# Patient Record
Sex: Female | Born: 1965 | Hispanic: Yes | Smoking: Never smoker
Health system: Southern US, Community
[De-identification: ages and names within clinical notes are randomized; demographics above are authoritative.]

## PROBLEM LIST (undated history)

## (undated) DIAGNOSIS — M51369 Other intervertebral disc degeneration, lumbar region without mention of lumbar back pain or lower extremity pain: Secondary | ICD-10-CM

## (undated) DIAGNOSIS — F329 Major depressive disorder, single episode, unspecified: Secondary | ICD-10-CM

## (undated) DIAGNOSIS — F32A Depression, unspecified: Secondary | ICD-10-CM

## (undated) DIAGNOSIS — K219 Gastro-esophageal reflux disease without esophagitis: Secondary | ICD-10-CM

## (undated) DIAGNOSIS — M5136 Other intervertebral disc degeneration, lumbar region: Secondary | ICD-10-CM

## (undated) DIAGNOSIS — E039 Hypothyroidism, unspecified: Secondary | ICD-10-CM

## (undated) DIAGNOSIS — M797 Fibromyalgia: Secondary | ICD-10-CM

## (undated) HISTORY — DX: Gastro-esophageal reflux disease without esophagitis: K21.9

## (undated) HISTORY — DX: Other intervertebral disc degeneration, lumbar region without mention of lumbar back pain or lower extremity pain: M51.369

## (undated) HISTORY — DX: Depression, unspecified: F32.A

## (undated) HISTORY — DX: Fibromyalgia: M79.7

## (undated) HISTORY — DX: Other intervertebral disc degeneration, lumbar region: M51.36

## (undated) HISTORY — PX: CARPAL TUNNEL RELEASE: SHX101

## (undated) HISTORY — DX: Hypothyroidism, unspecified: E03.9

---
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
## 2007-12-06 HISTORY — PX: CERVICAL FUSION: SHX112

## 2012-09-11 DIAGNOSIS — M5126 Other intervertebral disc displacement, lumbar region: Secondary | ICD-10-CM | POA: Insufficient documentation

## 2012-12-05 HISTORY — PX: LUMBAR LAMINECTOMY: SHX95

## 2018-03-22 ENCOUNTER — Encounter (INDEPENDENT_AMBULATORY_CARE_PROVIDER_SITE_OTHER): Payer: Self-pay

## 2018-03-22 ENCOUNTER — Encounter: Payer: Self-pay | Admitting: Physician Assistant

## 2018-03-22 ENCOUNTER — Ambulatory Visit (INDEPENDENT_AMBULATORY_CARE_PROVIDER_SITE_OTHER): Payer: Self-pay | Admitting: Physician Assistant

## 2018-03-22 VITALS — BP 130/75 | HR 72 | Ht 63.5 in | Wt 189.0 lb

## 2018-03-22 DIAGNOSIS — K219 Gastro-esophageal reflux disease without esophagitis: Secondary | ICD-10-CM

## 2018-03-22 DIAGNOSIS — Z6832 Body mass index (BMI) 32.0-32.9, adult: Secondary | ICD-10-CM

## 2018-03-22 DIAGNOSIS — M797 Fibromyalgia: Secondary | ICD-10-CM

## 2018-03-22 DIAGNOSIS — E6609 Other obesity due to excess calories: Secondary | ICD-10-CM

## 2018-03-22 DIAGNOSIS — E039 Hypothyroidism, unspecified: Secondary | ICD-10-CM

## 2018-03-22 DIAGNOSIS — R5383 Other fatigue: Secondary | ICD-10-CM

## 2018-03-22 DIAGNOSIS — Z6833 Body mass index (BMI) 33.0-33.9, adult: Secondary | ICD-10-CM

## 2018-03-22 DIAGNOSIS — Z6834 Body mass index (BMI) 34.0-34.9, adult: Secondary | ICD-10-CM | POA: Insufficient documentation

## 2018-03-22 DIAGNOSIS — F325 Major depressive disorder, single episode, in full remission: Secondary | ICD-10-CM | POA: Insufficient documentation

## 2018-03-22 MED ORDER — LEVOTHYROXINE SODIUM 100 MCG PO TABS: 100.0000 ug | ORAL_TABLET | Freq: Every day | ORAL | 0 refills | Status: DC

## 2018-03-22 MED ORDER — GABAPENTIN 100 MG PO CAPS: 100.0000 mg | ORAL_CAPSULE | Freq: Three times a day (TID) | ORAL | 5 refills | Status: DC

## 2018-03-22 MED ORDER — CITALOPRAM HYDROBROMIDE 10 MG PO TABS: 10.0000 mg | ORAL_TABLET | Freq: Every day | ORAL | 5 refills | Status: DC

## 2018-03-22 NOTE — Patient Instructions (Signed)
Will consider weight loss medication pending good labs.  Will send over levothyroxine with refills when get labs.

## 2018-03-25 DIAGNOSIS — R5383 Other fatigue: Secondary | ICD-10-CM | POA: Insufficient documentation

## 2018-03-25 NOTE — Progress Notes (Signed)
Subjective:    Patient ID: Melinda Browning, female    DOB: 04-Sep-1966, 52 y.o.   MRN: [id number]  HPI Pt is a 52 yo female who presents to the establish care. She has a PMH of GERD, hypothyroidism, fibromyalgia, depression.  Pt needs refills on all medications. Overall she is doing well. She would like to lose weight. She is not exercising or dieting. She tried phentermine in the past and did well. She is interested in restariting. She does admit to being very tried all the time she feels like she sleeps well.  .. Active Ambulatory Problems    Diagnosis Date Noted  . GERD (gastroesophageal reflux disease) 03/22/2018  . Hypothyroidism 03/22/2018  . Fibromyalgia 03/22/2018  . Depression, major, single episode, complete remission (HCC) 03/22/2018  . Obese 03/22/2018   Resolved Ambulatory Problems    Diagnosis Date Noted  . No Resolved Ambulatory Problems   No Additional Past Medical History   .[name]. Family History  Problem Relation Age of Onset  . Hypertension Mother   . Hypertension Sister   . Hypertension Brother    .[name]. Social History   Socioeconomic History  . Marital status: Widowed    Spouse name: Not on file  . Number of children: Not on file  . Years of education: Not on file  . Highest education level: Not on file  Occupational History  . Not on file  Social Needs  . Financial resource strain: Not on file  . Food insecurity:    Worry: Not on file    Inability: Not on file  . Transportation needs:    Medical: Not on file    Non-medical: Not on file  Tobacco Use  . Smoking status: Never Smoker  . Smokeless tobacco: Never Used  Substance and Sexual Activity  . Alcohol use: Never    Frequency: Never  . Drug use: Never  . Sexual activity: Not Currently  Lifestyle  . Physical activity:    Days per week: Not on file    Minutes per session: Not on file  . Stress: Not on file  Relationships  . Social connections:    Talks on phone: Not on file    Gets together:  Not on file    Attends religious service: Not on file    Active member of club or organization: Not on file    Attends meetings of clubs or organizations: Not on file    Relationship status: Not on file  . Intimate partner violence:    Fear of current or ex partner: Not on file    Emotionally abused: Not on file    Physically abused: Not on file    Forced sexual activity: Not on file  Other Topics Concern  . Not on file  Social History Narrative  . Not on file      Review of Systems  All other systems reviewed and are negative.      Objective:   Physical Exam  Constitutional: She is oriented to person, place, and time. She appears well-developed and well-nourished.  HENT:  Head: Normocephalic and atraumatic.  Eyes: Conjunctivae are normal. Right eye exhibits no discharge. Left eye exhibits no discharge.  Neck: Normal range of motion. Neck supple. No thyromegaly present.  Cardiovascular: Normal rate, regular rhythm and normal heart sounds.  Pulmonary/Chest: Effort normal and breath sounds normal. She has no wheezes.  Neurological: She is alert and oriented to person, place, and time.  Psychiatric: She has a normal mood  and affect. Her behavior is normal.          Assessment & Plan:  Marland KitchenMarland KitchenDiagnoses and all orders for this visit:  No energy -     TSH -     COMPLETE METABOLIC PANEL WITH GFR -     Z61 and Folate Panel -     Vitamin D 1,25 dihydroxy  Gastroesophageal reflux disease without esophagitis  Acquired hypothyroidism -     levothyroxine (SYNTHROID, LEVOTHROID) 100 MCG tablet; Take 1 tablet (100 mcg total) by mouth daily before breakfast. -     TSH  Fibromyalgia -     gabapentin (NEURONTIN) 100 MG capsule; Take 1 capsule (100 mg total) by mouth 3 (three) times daily.  Depression, major, single episode, complete remission (HCC) -     citalopram (CELEXA) 10 MG tablet; Take 1 tablet (10 mg total) by mouth daily.  Class 1 obesity due to excess calories without  serious comorbidity with body mass index (BMI) of 33.0 to 33.9 in adult -     TSH -     COMPLETE METABOLIC PANEL WITH GFR -     W96 and Folate Panel -     Vitamin D 1,25 dihydroxy   .[name] Depression screen [facility] 2/9 03/22/2018  Decreased Interest 0  Down, Depressed, Hopeless 0  PHQ - 2 Score 0  Altered sleeping 0  Tired, decreased energy 1  Change in appetite 0  Feeling bad or failure about yourself  0  Trouble concentrating 2  Moving slowly or fidgety/restless 0  Suicidal thoughts 0  PHQ-9 Score 3  Difficult doing work/chores Somewhat difficult   .[name] GAD 7 : Generalized Anxiety Score 03/22/2018  Nervous, Anxious, on Edge 0  Control/stop worrying 0  Worry too much - different things 0  Trouble relaxing 0  Restless 0  Easily annoyed or irritable 1  Afraid - awful might happen 0  Total GAD 7 Score 1  Anxiety Difficulty Somewhat difficult    Medication refilled. Labs ordered.   Need CPE.   Discussed weight loss. [name].Discussed low carb diet with 1500 calories and 80g of protein.  Exercising at least 150 minutes a week.  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  Pt has had phentermine in the past and worked well. We discussed potential starting again pending normal labs. Discussed not a long term medication.

## 2018-04-06 ENCOUNTER — Encounter: Payer: Self-pay | Admitting: Physician Assistant

## 2018-05-11 ENCOUNTER — Encounter: Payer: Self-pay | Admitting: Physician Assistant

## 2018-05-11 ENCOUNTER — Telehealth: Payer: Self-pay

## 2018-05-11 NOTE — Telephone Encounter (Signed)
Since it was a physical please call back and see if she would like to reschedule.

## 2018-05-11 NOTE — Telephone Encounter (Signed)
Left pt msg advising to call back and let us know if she wanted to reschedule

## 2018-05-11 NOTE — Telephone Encounter (Signed)
Pt called and left a VM at noon stating that she wanted to cancel her 1:40 physical appt with Jade.  Pt did not express wanting to reschedule.   FYI to PCP

## 2018-05-16 NOTE — Telephone Encounter (Signed)
Left pt another msg requesting she call back and let us know if she wants to reschedule her physical

## 2018-05-18 NOTE — Telephone Encounter (Signed)
Per chart, physical has been reschedule for 05-25-18

## 2018-05-25 ENCOUNTER — Encounter: Payer: Self-pay | Admitting: Physician Assistant

## 2018-07-23 ENCOUNTER — Other Ambulatory Visit: Payer: Self-pay | Admitting: Physician Assistant

## 2018-07-23 DIAGNOSIS — E039 Hypothyroidism, unspecified: Secondary | ICD-10-CM

## 2018-08-03 ENCOUNTER — Encounter: Payer: Self-pay | Admitting: Physician Assistant

## 2018-08-03 ENCOUNTER — Ambulatory Visit (INDEPENDENT_AMBULATORY_CARE_PROVIDER_SITE_OTHER): Payer: BLUE CROSS/BLUE SHIELD | Admitting: Physician Assistant

## 2018-08-03 ENCOUNTER — Ambulatory Visit (INDEPENDENT_AMBULATORY_CARE_PROVIDER_SITE_OTHER): Payer: BLUE CROSS/BLUE SHIELD

## 2018-08-03 VITALS — BP 121/82 | HR 70 | Ht 63.5 in | Wt 188.0 lb

## 2018-08-03 DIAGNOSIS — Z1231 Encounter for screening mammogram for malignant neoplasm of breast: Secondary | ICD-10-CM | POA: Diagnosis not present

## 2018-08-03 DIAGNOSIS — E041 Nontoxic single thyroid nodule: Secondary | ICD-10-CM

## 2018-08-03 DIAGNOSIS — E039 Hypothyroidism, unspecified: Secondary | ICD-10-CM

## 2018-08-03 DIAGNOSIS — Z1211 Encounter for screening for malignant neoplasm of colon: Secondary | ICD-10-CM

## 2018-08-03 DIAGNOSIS — Z6832 Body mass index (BMI) 32.0-32.9, adult: Secondary | ICD-10-CM

## 2018-08-03 DIAGNOSIS — F325 Major depressive disorder, single episode, in full remission: Secondary | ICD-10-CM

## 2018-08-03 DIAGNOSIS — Z131 Encounter for screening for diabetes mellitus: Secondary | ICD-10-CM

## 2018-08-03 DIAGNOSIS — E6609 Other obesity due to excess calories: Secondary | ICD-10-CM

## 2018-08-03 DIAGNOSIS — M797 Fibromyalgia: Secondary | ICD-10-CM

## 2018-08-03 DIAGNOSIS — Z0000 Encounter for general adult medical examination without abnormal findings: Secondary | ICD-10-CM | POA: Diagnosis not present

## 2018-08-03 DIAGNOSIS — Z13220 Encounter for screening for lipoid disorders: Secondary | ICD-10-CM

## 2018-08-03 DIAGNOSIS — R5383 Other fatigue: Secondary | ICD-10-CM

## 2018-08-03 MED ORDER — DULOXETINE HCL 30 MG PO CPEP: 30.0000 mg | ORAL_CAPSULE | Freq: Every day | ORAL | 1 refills | Status: DC

## 2018-08-03 MED ORDER — PHENTERMINE HCL 15 MG PO CAPS: 15.0000 mg | ORAL_CAPSULE | ORAL | 0 refills | Status: DC

## 2018-08-03 NOTE — Patient Instructions (Addendum)
Myofascial Pain Syndrome and Fibromyalgia Myofascial pain syndrome and fibromyalgia are both pain disorders. This pain may be felt mainly in your muscles.  Myofascial pain syndrome: ? Always has trigger points or tender points in the muscle that will cause pain when pressed. The pain may come and go. ? Usually affects your neck, upper back, and shoulder areas. The pain often radiates into your arms and hands.  Fibromyalgia: ? Has muscle pains and tenderness that come and go. ? Is often associated with fatigue and sleep disturbances. ? Has trigger points. ? Tends to be long-lasting (chronic), but is not life-threatening.  Fibromyalgia and myofascial pain are not the same. However, they often occur together. If you have both conditions, each can make the other worse. Both are common and can cause enough pain and fatigue to make day-to-day activities difficult. What are the causes? The exact causes of fibromyalgia and myofascial pain are not known. People with certain gene types may be more likely to develop fibromyalgia. Some factors can be triggers for both conditions, such as:  Spine disorders.  Arthritis.  Severe injury (trauma) and other physical stressors.  Being under a lot of stress.  A medical illness.  What are the signs or symptoms? Fibromyalgia The main symptom of fibromyalgia is widespread pain and tenderness in your muscles. This can vary over time. Pain is sometimes described as stabbing, shooting, or burning. You may have tingling or numbness, too. You may also have sleep problems and fatigue. You may wake up feeling tired and groggy (fibro fog). Other symptoms may include:  Bowel and bladder problems.  Headaches.  Visual problems.  Problems with odors and noises.  Depression or mood changes.  Painful menstrual periods (dysmenorrhea).  Dry skin or eyes.  Myofascial pain syndrome Symptoms of myofascial pain syndrome include:  Tight, ropy bands of  muscle.  Uncomfortable sensations in muscular areas, such as: ? Aching. ? Cramping. ? Burning. ? Numbness. ? Tingling. ? Muscle weakness.  Trouble moving certain muscles freely (range of motion).  How is this diagnosed? There are no specific tests to diagnose fibromyalgia or myofascial pain syndrome. Both can be hard to diagnose because their symptoms are common in many other conditions. Your health care provider may suspect one or both of these conditions based on your symptoms and medical history. Your health care provider will also do a physical exam. The key to diagnosing fibromyalgia is having pain, fatigue, and other symptoms for more than three months that cannot be explained by another condition. The key to diagnosing myofascial pain syndrome is finding trigger points in muscles that are tender and cause pain elsewhere in your body (referred pain). How is this treated? Treating fibromyalgia and myofascial pain often requires a team of health care providers. This usually starts with your primary provider and a physical therapist. You may also find it helpful to work with alternative health care providers, such as massage therapists or acupuncturists. Treatment for fibromyalgia may include medicines. This may include nonsteroidal anti-inflammatory drugs (NSAIDs), along with other medicines. Treatment for myofascial pain may also include:  NSAIDs.  Cooling and stretching of muscles.  Trigger point injections.  Sound wave (ultrasound) treatments to stimulate muscles.  Follow these instructions at home:  Take medicines only as directed by your health care provider.  Exercise as directed by your health care provider or physical therapist.  Try to avoid stressful situations.  Practice relaxation techniques to control your stress. You may want to try: ? Biofeedback. ? Visual  imagery. ? Hypnosis. ? Muscle relaxation. ? Yoga. ? Meditation.  Talk to your health care provider  about alternative treatments, such as acupuncture or massage treatment.  Maintain a healthy lifestyle. This includes eating a healthy diet and getting enough sleep.  Consider joining a support group.  Do not do activities that stress or strain your muscles. That includes repetitive motions and heavy lifting. Where to find more information:  National Fibromyalgia Association: www.fmaware.[place]: www.arthritis.org  American Chronic Pain Association: OEMDeals.dk Contact a health care provider if:  You have new symptoms.  Your symptoms get worse.  You have side effects from your medicines.  You have trouble sleeping.  Your condition is causing depression or anxiety. This information is not intended to replace advice given to you by your health care provider. Make sure you discuss any questions you have with your health care provider. Document Released: 11/21/2005 Document Revised: 04/28/2016 Document Reviewed: 08/27/2014 Elsevier Interactive Patient Education  2018 Ward Maintenance, Female Adopting a healthy lifestyle and getting preventive care can go a long way to promote health and wellness. Talk with your health care provider about what schedule of regular examinations is right for you. This is a good chance for you to check in with your provider about disease prevention and staying healthy. In between checkups, there are plenty of things you can do on your own. Experts have done a lot of research about which lifestyle changes and preventive measures are most likely to keep you healthy. Ask your health care provider for more information. Weight and diet Eat a healthy diet  Be sure to include plenty of vegetables, fruits, low-fat dairy products, and lean protein.  Do not eat a lot of foods high in solid fats, added sugars, or salt.  Get regular exercise. This is one of the most important things you can do for  your health. ? Most adults should exercise for at least 150 minutes each week. The exercise should increase your heart rate and make you sweat (moderate-intensity exercise). ? Most adults should also do strengthening exercises at least twice a week. This is in addition to the moderate-intensity exercise.  Maintain a healthy weight  Body mass index (BMI) is a measurement that can be used to identify possible weight problems. It estimates body fat based on height and weight. Your health care provider can help determine your BMI and help you achieve or maintain a healthy weight.  For females 18 years of age and older: ? A BMI below 18.5 is considered underweight. ? A BMI of 18.5 to 24.9 is normal. ? A BMI of 25 to 29.9 is considered overweight. ? A BMI of 30 and above is considered obese.  Watch levels of cholesterol and blood lipids  You should start having your blood tested for lipids and cholesterol at 51 years of age, then have this test every 5 years.  You may need to have your cholesterol levels checked more often if: ? Your lipid or cholesterol levels are high. ? You are older than 51 years of age. ? You are at high risk for heart disease.  Cancer screening Lung Cancer  Lung cancer screening is recommended for adults 36-79 years old who are at high risk for lung cancer because of a history of smoking.  A yearly low-dose CT scan of the lungs is recommended for people who: ? Currently smoke. ? Have quit within the past 15 years. ? Have at least a 30-pack-year history  of smoking. A pack year is smoking an average of one pack of cigarettes a day for 1 year.  Yearly screening should continue until it has been 15 years since you quit.  Yearly screening should stop if you develop a health problem that would prevent you from having lung cancer treatment.  Breast Cancer  Practice breast self-awareness. This means understanding how your breasts normally appear and feel.  It also  means doing regular breast self-exams. Let your health care provider know about any changes, no matter how small.  If you are in your 20s or 30s, you should have a clinical breast exam (CBE) by a health care provider every 1-3 years as part of a regular health exam.  If you are 15 or older, have a CBE every year. Also consider having a breast X-ray (mammogram) every year.  If you have a family history of breast cancer, talk to your health care provider about genetic screening.  If you are at high risk for breast cancer, talk to your health care provider about having an MRI and a mammogram every year.  Breast cancer gene (BRCA) assessment is recommended for women who have family members with BRCA-related cancers. BRCA-related cancers include: ? Breast. ? Ovarian. ? Tubal. ? Peritoneal cancers.  Results of the assessment will determine the need for genetic counseling and BRCA1 and BRCA2 testing.  Cervical Cancer Your health care provider may recommend that you be screened regularly for cancer of the pelvic organs (ovaries, uterus, and vagina). This screening involves a pelvic examination, including checking for microscopic changes to the surface of your cervix (Pap test). You may be encouraged to have this screening done every 3 years, beginning at age 79.  For women ages 32-65, health care providers may recommend pelvic exams and Pap testing every 3 years, or they may recommend the Pap and pelvic exam, combined with testing for human papilloma virus (HPV), every 5 years. Some types of HPV increase your risk of cervical cancer. Testing for HPV may also be done on women of any age with unclear Pap test results.  Other health care providers may not recommend any screening for nonpregnant women who are considered low risk for pelvic cancer and who do not have symptoms. Ask your health care provider if a screening pelvic exam is right for you.  If you have had past treatment for cervical cancer or  a condition that could lead to cancer, you need Pap tests and screening for cancer for at least 20 years after your treatment. If Pap tests have been discontinued, your risk factors (such as having a new sexual partner) need to be reassessed to determine if screening should resume. Some women have medical problems that increase the chance of getting cervical cancer. In these cases, your health care provider may recommend more frequent screening and Pap tests.  Colorectal Cancer  This type of cancer can be detected and often prevented.  Routine colorectal cancer screening usually begins at 51 years of age and continues through 51 years of age.  Your health care provider may recommend screening at an earlier age if you have risk factors for colon cancer.  Your health care provider may also recommend using home test kits to check for hidden blood in the stool.  A small camera at the end of a tube can be used to examine your colon directly (sigmoidoscopy or colonoscopy). This is done to check for the earliest forms of colorectal cancer.  Routine screening usually begins  at age 66.  Direct examination of the colon should be repeated every 5-10 years through 51 years of age. However, you may need to be screened more often if early forms of precancerous polyps or small growths are found.  Skin Cancer  Check your skin from head to toe regularly.  Tell your health care provider about any new moles or changes in moles, especially if there is a change in a mole's shape or color.  Also tell your health care provider if you have a mole that is larger than the size of a pencil eraser.  Always use sunscreen. Apply sunscreen liberally and repeatedly throughout the day.  Protect yourself by wearing long sleeves, pants, a wide-brimmed hat, and sunglasses whenever you are outside.  Heart disease, diabetes, and high blood pressure  High blood pressure causes heart disease and increases the risk of  stroke. High blood pressure is more likely to develop in: ? People who have blood pressure in the high end of the normal range (130-139/85-89 mm Hg). ? People who are overweight or obese. ? People who are African American.  If you are 25-106 years of age, have your blood pressure checked every 3-5 years. If you are 21 years of age or older, have your blood pressure checked every year. You should have your blood pressure measured twice-once when you are at a hospital or clinic, and once when you are not at a hospital or clinic. Record the average of the two measurements. To check your blood pressure when you are not at a hospital or clinic, you can use: ? An automated blood pressure machine at a pharmacy. ? A home blood pressure monitor.  If you are between 51 years and 79 years old, ask your health care provider if you should take aspirin to prevent strokes.  Have regular diabetes screenings. This involves taking a blood sample to check your fasting blood sugar level. ? If you are at a normal weight and have a low risk for diabetes, have this test once every three years after 51 years of age. ? If you are overweight and have a high risk for diabetes, consider being tested at a younger age or more often. Preventing infection Hepatitis B  If you have a higher risk for hepatitis B, you should be screened for this virus. You are considered at high risk for hepatitis B if: ? You were born in a country where hepatitis B is common. Ask your health care provider which countries are considered high risk. ? Your parents were born in a high-risk country, and you have not been immunized against hepatitis B (hepatitis B vaccine). ? You have HIV or AIDS. ? You use needles to inject street drugs. ? You live with someone who has hepatitis B. ? You have had sex with someone who has hepatitis B. ? You get hemodialysis treatment. ? You take certain medicines for conditions, including cancer, organ  transplantation, and autoimmune conditions.  Hepatitis C  Blood testing is recommended for: ? Everyone born from [birth year] through 1964. ? Anyone with known risk factors for hepatitis C.  Sexually transmitted infections (STIs)  You should be screened for sexually transmitted infections (STIs) including gonorrhea and chlamydia if: ? You are sexually active and are younger than 51 years of age. ? You are older than 51 years of age and your health care provider tells you that you are at risk for this type of infection. ? Your sexual activity has changed since you  were last screened and you are at an increased risk for chlamydia or gonorrhea. Ask your health care provider if you are at risk.  If you do not have HIV, but are at risk, it may be recommended that you take a prescription medicine daily to prevent HIV infection. This is called pre-exposure prophylaxis (PrEP). You are considered at risk if: ? You are sexually active and do not regularly use condoms or know the HIV status of your partner(s). ? You take drugs by injection. ? You are sexually active with a partner who has HIV.  Talk with your health care provider about whether you are at high risk of being infected with HIV. If you choose to begin PrEP, you should first be tested for HIV. You should then be tested every 3 months for as long as you are taking PrEP. Pregnancy  If you are premenopausal and you may become pregnant, ask your health care provider about preconception counseling.  If you may become pregnant, take 400 to 800 micrograms (mcg) of folic acid every day.  If you want to prevent pregnancy, talk to your health care provider about birth control (contraception). Osteoporosis and menopause  Osteoporosis is a disease in which the bones lose minerals and strength with aging. This can result in serious bone fractures. Your risk for osteoporosis can be identified using a bone density scan.  If you are 39 years of age or  older, or if you are at risk for osteoporosis and fractures, ask your health care provider if you should be screened.  Ask your health care provider whether you should take a calcium or vitamin D supplement to lower your risk for osteoporosis.  Menopause may have certain physical symptoms and risks.  Hormone replacement therapy may reduce some of these symptoms and risks. Talk to your health care provider about whether hormone replacement therapy is right for you. Follow these instructions at home:  Schedule regular health, dental, and eye exams.  Stay current with your immunizations.  Do not use any tobacco products including cigarettes, chewing tobacco, or electronic cigarettes.  If you are pregnant, do not drink alcohol.  If you are breastfeeding, limit how much and how often you drink alcohol.  Limit alcohol intake to no more than 1 drink per day for nonpregnant women. One drink equals 12 ounces of beer, 5 ounces of wine, or 1 ounces of hard liquor.  Do not use street drugs.  Do not share needles.  Ask your health care provider for help if you need support or information about quitting drugs.  Tell your health care provider if you often feel depressed.  Tell your health care provider if you have ever been abused or do not feel safe at home. This information is not intended to replace advice given to you by your health care provider. Make sure you discuss any questions you have with your health care provider. Document Released: 06/06/2011 Document Revised: 04/28/2016 Document Reviewed: 08/25/2015 Elsevier Interactive Patient Education  [employer].

## 2018-08-03 NOTE — Progress Notes (Signed)
Call pt: thyroid nodule 1.2cm appears benign. Does not meet biopsy criteria. Radiologist recommends follow up in 1 year.

## 2018-08-03 NOTE — Progress Notes (Signed)
Subjective:     Melinda Browning is a 52 y.o. female and is here for a comprehensive physical exam. The patient reports problems - she continues to struggle with chronic fatigue and weight. she would like to see what she can do about it. she is not exercising but walks a lot at work.  she has hx of fibromyalgia.   Social History   Socioeconomic History  . Marital status: Widowed    Spouse name: Not on file  . Number of children: Not on file  . Years of education: Not on file  . Highest education level: Not on file  Occupational History  . Not on file  Social Needs  . Financial resource strain: Not on file  . Food insecurity:    Worry: Not on file    Inability: Not on file  . Transportation needs:    Medical: Not on file    Non-medical: Not on file  Tobacco Use  . Smoking status: Never Smoker  . Smokeless tobacco: Never Used  Substance and Sexual Activity  . Alcohol use: Never    Frequency: Never  . Drug use: Never  . Sexual activity: Not Currently  Lifestyle  . Physical activity:    Days per week: Not on file    Minutes per session: Not on file  . Stress: Not on file  Relationships  . Social connections:    Talks on phone: Not on file    Gets together: Not on file    Attends religious service: Not on file    Active member of club or organization: Not on file    Attends meetings of clubs or organizations: Not on file    Relationship status: Not on file  . Intimate partner violence:    Fear of current or ex partner: Not on file    Emotionally abused: Not on file    Physically abused: Not on file    Forced sexual activity: Not on file  Other Topics Concern  . Not on file  Social History Narrative  . Not on file   Health Maintenance  Topic Date Due  . MAMMOGRAM  03/20/2017  . COLONOSCOPY  03/20/2017  . PAP SMEAR  08/06/2018 (Originally [birth year]/20/1988)  . INFLUENZA VACCINE  03/23/2019 (Originally 07/05/2018)  . HIV Screening  08/04/2019 (Originally 03/20/1982)  . TETANUS/TDAP   12/06/2023    The following portions of the patient's history were reviewed and updated as appropriate: allergies, current medications, past family history, past medical history, past social history, past surgical history and problem list.  Review of Systems Pertinent items noted in HPI and remainder of comprehensive ROS otherwise negative.   Objective:    BP 121/82   Pulse 70   Ht 5' 3.5" (1.613 m)   Wt 188 lb (85.3 kg)   LMP 07/20/2018 Comment: Pt has had tubal ligation  BMI 32.78 kg/m  General appearance: alert, cooperative, appears stated age and mildly obese Head: Normocephalic, without obvious abnormality, atraumatic Eyes: conjunctivae/corneas clear. PERRL, EOM's intact. Fundi benign. Ears: normal TM's and external ear canals both ears Nose: Nares normal. Septum midline. Mucosa normal. No drainage or sinus tenderness. Throat: lips, mucosa, and tongue normal; teeth and gums normal Neck: no adenopathy, no carotid bruit, no JVD, supple, symmetrical, trachea midline and thyroid not enlarged, symmetric, no tenderness/mass/nodules Back: symmetric, no curvature. ROM normal. No CVA tenderness. Lungs: clear to auscultation bilaterally Heart: regular rate and rhythm, S1, S2 normal, no murmur, click, rub or gallop Abdomen: soft, non-tender;  bowel sounds normal; no masses,  no organomegaly Extremities: extremities normal, atraumatic, no cyanosis or edema Pulses: 2+ and symmetric Skin: Skin color, texture, turgor normal. No rashes or lesions Lymph nodes: Cervical, supraclavicular, and axillary nodes normal. Neurologic: Alert and oriented X 3, normal strength and tone. Normal symmetric reflexes. Normal coordination and gait    Assessment:    Healthy female exam.      Plan:      Marland KitchenMarland KitchenLenox was seen today for annual exam.  Diagnoses and all orders for this visit:  Routine physical examination -     B12 and Folate Panel -     VITAMIN D 25 Hydroxy (Vit-D Deficiency, Fractures) -      COMPLETE METABOLIC PANEL WITH GFR -     TSH -     Lipid Panel w/reflex Direct LDL -     US THYROID  Colon cancer screening -     Ambulatory referral to Gastroenterology  Visit for screening mammogram -     MM 3D SCREEN BREAST BILATERAL  Acquired hypothyroidism -     TSH  No energy -     B12 and Folate Panel -     VITAMIN D 25 Hydroxy (Vit-D Deficiency, Fractures) -     COMPLETE METABOLIC PANEL WITH GFR  Screening for lipid disorders -     Lipid Panel w/reflex Direct LDL  Screening for diabetes mellitus -     COMPLETE METABOLIC PANEL WITH GFR  Class 1 obesity due to excess calories without serious comorbidity with body mass index (BMI) of 32.0 to 32.9 in adult -     phentermine 15 MG capsule; Take 1 capsule (15 mg total) by mouth every morning.  Fibromyalgia -     DULoxetine (CYMBALTA) 30 MG capsule; Take 1 capsule (30 mg total) by mouth daily.  Depression, major, single episode, complete remission (HCC) -     DULoxetine (CYMBALTA) 30 MG capsule; Take 1 capsule (30 mg total) by mouth daily.  .. Depression screen [facility] 2/9 08/06/2018 03/22/2018  Decreased Interest 1 0  Down, Depressed, Hopeless 0 0  PHQ - 2 Score 1 0  Altered sleeping 0 0  Tired, decreased energy 3 1  Change in appetite 1 0  Feeling bad or failure about yourself  0 0  Trouble concentrating 1 2  Moving slowly or fidgety/restless 0 0  Suicidal thoughts 0 0  PHQ-9 Score 6 3  Difficult doing work/chores Somewhat difficult Somewhat difficult    Discussed 150 minutes of exercise a week.  Encouraged vitamin D 1000 units and Calcium 1300mg  or 4 servings of dairy a day.  Mammogram and colonoscopy ordered.  Fasting labs ordered.   [name].Discussed low carb diet with 1500 calories and 80g of protein.  Exercising at least 150 minutes a week.  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  Phentermine started. Discussed side effects.  Follow up in 4 weeks.   Stop celexa. Start cymbalta. Could help more with chronic  fatigue/fibromyalgia/depression.  See After Visit Summary for Counseling Recommendations

## 2018-08-04 LAB — COMPLETE METABOLIC PANEL WITHOUT GFR
AG Ratio: 1.6 (calc) (ref 1.0–2.5)
ALT: 24 U/L (ref 6–29)
AST: 20 U/L (ref 10–35)
Albumin: 4.2 g/dL (ref 3.6–5.1)
Alkaline phosphatase (APISO): 87 U/L (ref 33–130)
BUN: 13 mg/dL (ref 7–25)
CO2: 25 mmol/L (ref 20–32)
Calcium: 9.4 mg/dL (ref 8.6–10.4)
Chloride: 106 mmol/L (ref 98–110)
Creat: 0.73 mg/dL (ref 0.50–1.05)
GFR, Est African American: 111 mL/min/{1.73_m2}
GFR, Est Non African American: 95 mL/min/{1.73_m2}
Globulin: 2.7 g/dL (ref 1.9–3.7)
Glucose, Bld: 89 mg/dL (ref 65–99)
Potassium: 4.1 mmol/L (ref 3.5–5.3)
Sodium: 140 mmol/L (ref 135–146)
Total Bilirubin: 0.2 mg/dL (ref 0.2–1.2)
Total Protein: 6.9 g/dL (ref 6.1–8.1)

## 2018-08-04 LAB — LIPID PANEL W/REFLEX DIRECT LDL
Cholesterol: 195 mg/dL
HDL: 50 mg/dL — ABNORMAL LOW
LDL Cholesterol (Calc): 121 mg/dL — ABNORMAL HIGH
Non-HDL Cholesterol (Calc): 145 mg/dL — ABNORMAL HIGH
Total CHOL/HDL Ratio: 3.9 (calc)
Triglycerides: 129 mg/dL

## 2018-08-04 LAB — VITAMIN D 25 HYDROXY (VIT D DEFICIENCY, FRACTURES): Vit D, 25-Hydroxy: 22 ng/mL — ABNORMAL LOW (ref 30–100)

## 2018-08-04 LAB — B12 AND FOLATE PANEL
Folate: 5.4 ng/mL — ABNORMAL LOW
Vitamin B-12: 339 pg/mL (ref 200–1100)

## 2018-08-04 LAB — TSH: TSH: 1.93 mIU/L

## 2018-08-06 ENCOUNTER — Encounter: Payer: Self-pay | Admitting: Physician Assistant

## 2018-08-06 DIAGNOSIS — E559 Vitamin D deficiency, unspecified: Secondary | ICD-10-CM | POA: Insufficient documentation

## 2018-08-06 NOTE — Progress Notes (Signed)
Call pt: cholesterol looks good. Overall 10 year CV risk is 1.3 percent which is good.  Thyroid looks great.  Kidney, liver, glucose look great.  Vitamin D low. Start d3 1000 units daily.  b12 on low normal start 1000mg  daily.

## 2018-08-20 NOTE — Progress Notes (Signed)
Call pt: normal mammogram follow up in 1 year.

## 2018-08-28 ENCOUNTER — Other Ambulatory Visit: Payer: Self-pay | Admitting: Physician Assistant

## 2018-08-28 DIAGNOSIS — E039 Hypothyroidism, unspecified: Secondary | ICD-10-CM

## 2018-08-30 ENCOUNTER — Ambulatory Visit: Payer: BLUE CROSS/BLUE SHIELD | Admitting: Physician Assistant

## 2018-08-31 ENCOUNTER — Ambulatory Visit: Payer: BLUE CROSS/BLUE SHIELD | Admitting: Physician Assistant

## 2018-09-05 ENCOUNTER — Other Ambulatory Visit: Payer: Self-pay | Admitting: Physician Assistant

## 2018-09-05 DIAGNOSIS — Z6832 Body mass index (BMI) 32.0-32.9, adult: Principal | ICD-10-CM

## 2018-09-05 DIAGNOSIS — E6609 Other obesity due to excess calories: Secondary | ICD-10-CM

## 2018-09-12 ENCOUNTER — Encounter: Payer: Self-pay | Admitting: Family Medicine

## 2018-09-12 ENCOUNTER — Ambulatory Visit (INDEPENDENT_AMBULATORY_CARE_PROVIDER_SITE_OTHER): Payer: 59 | Admitting: Family Medicine

## 2018-09-12 VITALS — BP 129/78 | HR 93 | Temp 98.0°F | Ht 64.0 in | Wt 189.0 lb

## 2018-09-12 DIAGNOSIS — L0291 Cutaneous abscess, unspecified: Secondary | ICD-10-CM | POA: Diagnosis not present

## 2018-09-12 MED ORDER — DOXYCYCLINE HYCLATE 100 MG PO TABS: 100.0000 mg | ORAL_TABLET | Freq: Two times a day (BID) | ORAL | 0 refills | Status: DC

## 2018-09-12 NOTE — Patient Instructions (Signed)
Thank you for coming in today. Start doxycyline antibiotic now.  Recheck if not getting better or if getting worse.    Cellulitis, Adult Cellulitis is a skin infection. The infected area is usually red and tender. This condition occurs most often in the arms and lower legs. The infection can travel to the muscles, blood, and underlying tissue and become serious. It is very important to get treated for this condition. What are the causes? Cellulitis is caused by bacteria. The bacteria enter through a break in the skin, such as a cut, burn, insect bite, open sore, or crack. What increases the risk? This condition is more likely to occur in people who:  Have a weak defense system (immune system).  Have open wounds on the skin such as cuts, burns, bites, and scrapes. Bacteria can enter the body through these open wounds.  Are older.  Have diabetes.  Have a type of long-lasting (chronic) liver disease (cirrhosis) or kidney disease.  Use IV drugs.  What are the signs or symptoms? Symptoms of this condition include:  Redness, streaking, or spotting on the skin.  Swollen area of the skin.  Tenderness or pain when an area of the skin is touched.  Warm skin.  Fever.  Chills.  Blisters.  How is this diagnosed? This condition is diagnosed based on a medical history and physical exam. You may also have tests, including:  Blood tests.  Lab tests.  Imaging tests.  How is this treated? Treatment for this condition may include:  Medicines, such as antibiotic medicines or antihistamines.  Supportive care, such as rest and application of cold or warm cloths (cold or warm compresses) to the skin.  Hospital care, if the condition is severe.  The infection usually gets better within 1-2 days of treatment. Follow these instructions at home:  Take over-the-counter and prescription medicines only as told by your health care provider.  If you were prescribed an antibiotic  medicine, take it as told by your health care provider. Do not stop taking the antibiotic even if you start to feel better.  Drink enough fluid to keep your urine clear or pale yellow.  Do not touch or rub the infected area.  Raise (elevate) the infected area above the level of your heart while you are sitting or lying down.  Apply warm or cold compresses to the affected area as told by your health care provider.  Keep all follow-up visits as told by your health care provider. This is important. These visits let your health care provider make sure a more serious infection is not developing. Contact a health care provider if:  You have a fever.  Your symptoms do not improve within 1-2 days of starting treatment.  Your bone or joint underneath the infected area becomes painful after the skin has healed.  Your infection returns in the same area or another area.  You notice a swollen bump in the infected area.  You develop new symptoms.  You have a general ill feeling (malaise) with muscle aches and pains. Get help right away if:  Your symptoms get worse.  You feel very sleepy.  You develop vomiting or diarrhea that persists.  You notice red streaks coming from the infected area.  Your red area gets larger or turns dark in color. This information is not intended to replace advice given to you by your health care provider. Make sure you discuss any questions you have with your health care provider. Document Released: 08/31/2005 Document  Revised: 03/31/2016 Document Reviewed: 09/30/2015 Elsevier Interactive Patient Education  2018 [employer].   Manson Passey Recluse Spider Bite Manson Passey recluse spiders can inject poison (venom) into the wound when they bite a person. In most cases, a bite from a brown recluse spider causes mild redness and swelling around the bite. However, in rare cases, bites can be serious and even life threatening. Brown recluse spiders can be dark brown to light  tan in color. On their back, they have a band of darker color that is shaped like a violin. They are found mainly in the [place] part of the [place] as far [place] as [place].They may be found outdoors underneath items lying on the ground. They may also live indoors in places that are out of the way, such as attics. What are the causes? A spider bite is often caused by a person accidentally making contact with a spider in a way that traps the spider against the person's skin. What are the signs or symptoms? Symptoms of this condition may include:  Pain and redness at the site of the bite. This may begin as a small, painful blister with redness around it. The blister may break open and create a sore (ulcer) that can get worse and spread over time. This may result in an area of tissue death up to 12 inches (30 cm) wide.  A general feeling of sickness (malaise).  Nausea or vomiting.  Fever.  Body aches.  Symptoms may get worse during several days after you are bitten. How is this diagnosed? This condition may be diagnosed based on your symptoms and a physical exam. Your health care provider will ask about the history of your injury and any details you may have about the spider. How is this treated? There is no single cure (antidote) to treat this bite. Treatment will usually focus on caring for the wound. Options may include:  Covering the wound with a bandage (dressing).  Medicines to treat the ulcer and help prevent tissue death.  Antibiotic medicine.  Tetanus shot.  Surgery to remove damaged tissue. This may be needed if a large ulcer develops in the bite area.  Follow these instructions at home: Medicines  Take or apply over-the-counter and prescription medicines only as told by your health care provider.  If you were prescribed an antibiotic medicine, take or apply it as told by your health care provider. Do not stop using the antibiotic even if your condition  improves. Wound care  Follow instructions from your health care provider about how to take care of your wound. Make sure you: ? Wash your hands with soap and water before you change your dressing. If soap and water are not available, use hand sanitizer. ? Change your dressing as told by your health care provider.  Keep the bite area clean and dry. Wash the bite area daily with soap and water as told by your health care provider.  Do not scratch the bite area. General instructions  If directed, apply ice to the bite area. ? Put ice in a plastic bag. ? Place a towel between your skin and the bag. ? Leave the ice on for 20 minutes, 2-3 times per day.  Raise (elevate) the bite area above the level of your heart while you are sitting or lying down, if this is possible.  Keep all follow-up visits as told by your health care provider. This is important. Contact a health care provider if:  Your symptoms get worse or do  not improve after 24 hours.  You have increasing redness, swelling, or pain in the bite area. Get help right away if:  Your ulcer appears to be getting larger or growing deeper.  You have fluid, blood, or pus coming from the bite area.  You have chills or a fever.  You feel nauseous or you vomit.  You have muscle aches.  You feel unusually weak or tired.  You have involuntary muscle movements (convulsions).  You develop a rash.  You urinate less frequently than usual.  You have blood in your urine or other unusual bleeding.  Your skin turns yellow. This information is not intended to replace advice given to you by your health care provider. Make sure you discuss any questions you have with your health care provider. Document Released: 11/21/2005 Document Revised: 04/28/2016 Document Reviewed: 04/08/2015 Elsevier Interactive Patient Education  2018 [employer].

## 2018-09-12 NOTE — Progress Notes (Signed)
Insect

## 2018-09-12 NOTE — Progress Notes (Signed)
Melinda Browning is a 52 y.o. female who presents to [name] today for spider bite or abscess.  [name] developed a painful red papule on her right medial thigh 2 days ago.  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  She subsequently developed redness and swelling in this area.  The redness and swelling has worsened over the last day and a half.  She has a bit of clear discharge coming from the swelling on her medial thigh.  She notes a coworker was bitten by a brown recluse spider in the past and she is worried that she may have been bitten by spider.  She cannot recall seeing an actual spider bite her but is worried about a spider bite.  She has not tried much treatment yet.  No fevers chills nausea vomiting or diarrhea.   ROS as above:  Exam:  BP 129/78   Pulse 93   Temp 98 F (36.7 C) (Oral)   Ht 5\' 4"  (1.626 m)   Wt 189 lb (85.7 kg)   BMI 32.44 kg/m  Wt Readings from Last 5 Encounters:  09/12/18 189 lb (85.7 kg)  08/03/18 188 lb (85.3 kg)  03/22/18 189 lb (85.7 kg)    Gen: Well NAD HEENT: EOMI,  MMM Lungs: Normal work of breathing. CTABL Heart: RRR no MRG Abd: NABS, Soft. Nondistended, Nontender Exts: Brisk capillary refill, warm and well perfused.  Right distal medial thigh erythremia with a bit of excoriation or induration.  Clear discharge present.  No fluctuance.  Mildly tender to palpation.      Lab and Radiology Results No results found for this or any previous visit (from the past 72 hour(s)). No results found.    Assessment and Plan: 52 y.o. female with right medial thigh erythema and induration.  Likely folliculitis or cellulitis.  Possible spider bite.  Plan for treatment with doxycycline.  Wound culture pending.  Culture may be of limited utility as there  was not a lot of discharge at the time of swab.   Recheck if not improving or if worsening.   Orders Placed This Encounter  Procedures  . Wound culture    Order Specific Question:   Source    Answer:   right leg   Meds ordered this encounter  Medications  . doxycycline (VIBRA-TABS) 100 MG tablet    Sig: Take 1 tablet (100 mg total) by mouth 2 (two) times daily.    Dispense:  14 tablet    Refill:  0     Historical information moved to improve visibility of documentation.  No past medical history on file. No past surgical history on file. Social History   Tobacco Use  . Smoking status: Never Smoker  . Smokeless tobacco: Never Used  Substance Use Topics  . Alcohol use: Never    Frequency: Never   family history includes Hypertension in her brother, mother, and sister.  Medications: Current Outpatient Medications  Medication Sig Dispense Refill  . DULoxetine (CYMBALTA) 30 MG capsule Take 1 capsule (30 mg total) by mouth daily. 30 capsule 1  . gabapentin (NEURONTIN) 100 MG capsule Take 1 capsule (100 mg total) by mouth 3 (three) times daily. 90 capsule 5  . levothyroxine (SYNTHROID, LEVOTHROID) 100 MCG tablet TAKE 1 TABLET BY MOUTH ONCE DAILY BEFORE BREAKFAST 30 tablet 0  .  phentermine 15 MG capsule TAKE 1 CAPSULE BY MOUTH ONCE DAILY IN THE MORNING 30 capsule 0  . doxycycline (VIBRA-TABS) 100 MG tablet Take 1 tablet (100 mg total) by mouth 2 (two) times daily. 14 tablet 0   No current facility-administered medications for this visit.    Allergies  Allergen Reactions  . Naproxen Rash    Face rash     Discussed warning signs or symptoms. Please see discharge instructions. Patient expresses understanding.

## 2018-09-13 ENCOUNTER — Telehealth: Payer: Self-pay | Admitting: Physician Assistant

## 2018-09-13 NOTE — Telephone Encounter (Addendum)
Daughter called. Mom was told she would be written out of work October 10th & 11th but letter  only says the 10th. Thanks!

## 2018-09-14 ENCOUNTER — Encounter: Payer: Self-pay | Admitting: Family Medicine

## 2018-09-14 NOTE — Telephone Encounter (Signed)
Spoke to patient and she stated that October 12,th was fine. Lyndsey Demos,CMA

## 2018-09-14 NOTE — Telephone Encounter (Signed)
Thanks

## 2018-09-14 NOTE — Telephone Encounter (Signed)
New letter written seen to return to work on October 12.  Please clarify with patient if that is what she wants.

## 2018-09-15 LAB — WOUND CULTURE
MICRO NUMBER:: 91215109
SPECIMEN QUALITY:: ADEQUATE

## 2018-09-17 ENCOUNTER — Telehealth: Payer: Self-pay

## 2018-09-17 ENCOUNTER — Ambulatory Visit: Payer: 59 | Admitting: Family Medicine

## 2018-09-17 MED ORDER — CEFDINIR 300 MG PO CAPS: 300.0000 mg | ORAL_CAPSULE | Freq: Two times a day (BID) | ORAL | 0 refills | Status: DC

## 2018-09-17 NOTE — Telephone Encounter (Signed)
Pt advised. Will start new medication today. Scheduled follow up with Dr Denyse Amass tomorrow

## 2018-09-17 NOTE — Telephone Encounter (Signed)
Omnicef antibiotic sent to pharmacy.  Start that and follow up with me tomorrow. Go to ED/Urgent care if worse or cannot want.  Continue doxycycline.

## 2018-09-17 NOTE — Telephone Encounter (Signed)
Pt was evaluated on 09/12/18 by dr Melinda Browning in office for possible spider bite. Pt was given antibiotic and did not improve, so she was seen at ER on 09/15/18 for worsening of SX.  Pt calls today stating that she is not improving, she has night sweats and the area is swollen, red, warm, and pussy. Denied any fever. Pt tells me she was told to make a follow up appointment today to be re-evaluated or she must go back to emergency room. Dr Melinda Browning, can you please advise on what to have pt do?   No openings in office today but you do have some tomorrow. Visit available in MyChart

## 2018-09-18 ENCOUNTER — Encounter: Payer: Self-pay | Admitting: Family Medicine

## 2018-09-18 ENCOUNTER — Ambulatory Visit (INDEPENDENT_AMBULATORY_CARE_PROVIDER_SITE_OTHER): Payer: 59 | Admitting: Family Medicine

## 2018-09-18 VITALS — BP 120/72 | HR 88 | Ht 64.0 in | Wt 188.0 lb

## 2018-09-18 DIAGNOSIS — L03115 Cellulitis of right lower limb: Secondary | ICD-10-CM

## 2018-09-18 MED ORDER — TRIAMCINOLONE ACETONIDE 0.5 % EX CREA: 1.0000 "application " | TOPICAL_CREAM | Freq: Two times a day (BID) | CUTANEOUS | 1 refills | Status: DC

## 2018-09-18 NOTE — Patient Instructions (Signed)
Thank you for coming in today. Finish both antibiotics.  Use the triamcinolone cream twice daily as needed for itching.  Recheck as needed.

## 2018-09-18 NOTE — Progress Notes (Signed)
Melinda Browning is a 52 y.o. female who presents to [name]: Primary Care [facility] Medicine today for follow-up cellulitis/abscess.   Ms. Belleville was seen last week for right thigh spider bite versus cellulitis versus early abscess.  Culture was obtained but unfortunately not enough material was obtained to provide a positive result.  She was empirically treated with doxycycline which did not help much.  She had worsened and called yesterday saying that she was feeling ill and was prescribed Omnicef and advised to follow-up today.  In the interim she did follow-up in the emergency room but did not receive much work-up and was advised to follow-up with me today.  She notes after 2 doses of Omnicef she started to feel a lot better and she notes the pain and redness is starting to diminish now becoming slightly itchy on her right thigh.  No fevers chills nausea vomiting or diarrhea.   ROS as above:  Exam:  BP 120/72   Pulse 88   Ht 5\' 4"  (1.626 m)   Wt 188 lb (85.3 kg)   BMI 32.27 kg/m  Wt Readings from Last 5 Encounters:  09/18/18 188 lb (85.3 kg)  09/12/18 189 lb (85.7 kg)  08/03/18 188 lb (85.3 kg)  03/22/18 189 lb (85.7 kg)    Gen: Well NAD HEENT: EOMI,  MMM Lungs: Normal work of breathing. CTABL Heart: RRR no MRG Abd: NABS, Soft. Nondistended, Nontender Exts: Brisk capillary refill, warm and well perfused. Skin right thigh no significant induration minimal erythema no fluctuance.  Nontender.  Lab and Radiology Results No results found for this or any previous visit (from the past 72 hour(s)). No results found.    Assessment and Plan: 52 y.o. female with resolving cellulitis right thigh.  Plan to finish out doxycycline course and also finish out new Omnicef course.  Reasonable to treat resolving rash with triamcinolone cream.  I am not sure if this truly was a spider bite.  Watchful  waiting.  Recheck as needed.   No orders of the defined types were placed in this encounter.  Meds ordered this encounter  Medications  . triamcinolone cream (KENALOG) 0.5 %    Sig: Apply 1 application topically 2 (two) times daily. To affected areas for itching    Dispense:  30 g    Refill:  1     Historical information moved to improve visibility of documentation.  No past medical history on file. No past surgical history on file. Social History   Tobacco Use  . Smoking status: Never Smoker  . Smokeless tobacco: Never Used  Substance Use Topics  . Alcohol use: Never    Frequency: Never   family history includes Hypertension in her brother, mother, and sister.  Medications: Current Outpatient Medications  Medication Sig Dispense Refill  . cefdinir (OMNICEF) 300 MG capsule Take 1 capsule (300 mg total) by mouth 2 (two) times daily. 14 capsule 0  . doxycycline (VIBRA-TABS) 100 MG tablet Take 1 tablet (100 mg total) by mouth 2 (two) times daily. 14 tablet 0  . DULoxetine (CYMBALTA) 30 MG capsule Take 1 capsule (30 mg total) by mouth daily. 30 capsule 1  . gabapentin (NEURONTIN) 100 MG capsule Take 1 capsule (100 mg total) by mouth 3 (three) times daily. 90 capsule 5  . levothyroxine (SYNTHROID, LEVOTHROID) 100 MCG tablet TAKE 1 TABLET BY MOUTH ONCE DAILY BEFORE BREAKFAST 30 tablet 0  . phentermine 15 MG capsule TAKE 1 CAPSULE  BY MOUTH ONCE DAILY IN THE MORNING 30 capsule 0  . triamcinolone cream (KENALOG) 0.5 % Apply 1 application topically 2 (two) times daily. To affected areas for itching 30 g 1   No current facility-administered medications for this visit.    Allergies  Allergen Reactions  . Naproxen Rash    Face rash     Discussed warning signs or symptoms. Please see discharge instructions. Patient expresses understanding.

## 2018-09-19 ENCOUNTER — Ambulatory Visit (INDEPENDENT_AMBULATORY_CARE_PROVIDER_SITE_OTHER): Payer: 59 | Admitting: Family Medicine

## 2018-09-19 VITALS — BP 148/91 | HR 94 | Ht 63.0 in | Wt 189.0 lb

## 2018-09-19 DIAGNOSIS — T783XXA Angioneurotic edema, initial encounter: Secondary | ICD-10-CM | POA: Diagnosis not present

## 2018-09-19 DIAGNOSIS — L03115 Cellulitis of right lower limb: Secondary | ICD-10-CM | POA: Diagnosis not present

## 2018-09-19 MED ORDER — CLINDAMYCIN HCL 300 MG PO CAPS: 300.0000 mg | ORAL_CAPSULE | Freq: Three times a day (TID) | ORAL | 0 refills | Status: DC

## 2018-09-19 MED ORDER — TRIAMCINOLONE ACETONIDE 0.5 % EX CREA: 1.0000 "application " | TOPICAL_CREAM | Freq: Two times a day (BID) | CUTANEOUS | 1 refills | Status: DC

## 2018-09-19 NOTE — Progress Notes (Signed)
Melinda Browning is a 52 y.o. female who presents to [facility] Melinda Browning: Primary Care Sports Medicine today for cellulitis follow-up and new angioedema.Melinda Browning been treated several times now for cellulitis/potential spider bite on her right inner thigh. She was originally treated with doxycycline which did not appear to be effective.  2 days ago she was started on Omnicef which she notes was improving her symptoms.  This morning she woke with swelling of her upper lip.  She denies any tongue swelling or trouble breathing body rash fevers or chills. She notes that she does have a pertinent history of some throat swelling associated in the past with gluten.  She denies any known history of angioedema due to medications or known antibiotic allergies.     ROS as above:  Exam:  BP (!) 148/91   Pulse 94   Ht 5\' 3"  (1.6 m)   Wt 189 lb (85.7 kg)   BMI 33.48 kg/m  Wt Readings from Last 5 Encounters:  09/19/18 189 lb (85.7 kg)  09/18/18 188 lb (85.3 kg)  09/12/18 189 lb (85.7 kg)  08/03/18 188 lb (85.3 kg)  03/22/18 189 lb (85.7 kg)    Gen: Well NAD HEENT: EOMI,  MMM minimal swelling upper lip. No tongue swelling Lungs: Normal work of breathing. CTABL Heart: RRR no MRG Abd: NABS, Soft. Nondistended, Nontender Exts: Brisk capillary refill, warm and well perfused.  Skin right thigh mild erythremia with crust.        Lab and Radiology Results No results found for this or any previous visit (from the past 72 hour(s)). No results found.    Assessment and Plan: 52 y.o. female with angioedema likely due to the new Omnicef antibiotics.  However it is possible that some other exposure is the cause.  Infection can sometimes be a cause for angioedema.  Regardless discontinue both doxycycline and Omnicef.  Omnicef added to allergy list.  Treat mild angioedema with twice daily Zyrtec and Pepcid.  Watchful waiting  recheck as needed.  Continued cellulitis.  Treat with clindamycin.  Recheck if not improving.    No orders of the defined types were placed in this encounter.  Meds ordered this encounter  Medications  . clindamycin (CLEOCIN) 300 MG capsule    Sig: Take 1 capsule (300 mg total) by mouth 3 (three) times daily.    Dispense:  21 capsule    Refill:  0  . triamcinolone cream (KENALOG) 0.5 %    Sig: Apply 1 application topically 2 (two) times daily. To affected areas for itching    Dispense:  30 g    Refill:  1     Historical information moved to improve visibility of documentation.  No past medical history on file. No past surgical history on file. Social History   Tobacco Use  . Smoking status: Never Smoker  . Smokeless tobacco: Never Used  Substance Use Topics  . Alcohol use: Never    Frequency: Never   family history includes Hypertension in her brother, mother, and sister.  Medications: Current Outpatient Medications  Medication Sig Dispense Refill  . DULoxetine (CYMBALTA) 30 MG capsule Take 1 capsule (30 mg total) by mouth daily. 30 capsule 1  . gabapentin (NEURONTIN) 100 MG capsule Take 1 capsule (100 mg total) by mouth 3 (three) times daily. 90 capsule 5  . levothyroxine (SYNTHROID, LEVOTHROID) 100 MCG tablet TAKE 1 TABLET BY MOUTH ONCE DAILY BEFORE BREAKFAST 30 tablet 0  . phentermine  15 MG capsule TAKE 1 CAPSULE BY MOUTH ONCE DAILY IN THE MORNING 30 capsule 0  . triamcinolone cream (KENALOG) 0.5 % Apply 1 application topically 2 (two) times daily. To affected areas for itching 30 g 1  . clindamycin (CLEOCIN) 300 MG capsule Take 1 capsule (300 mg total) by mouth 3 (three) times daily. 21 capsule 0   No current facility-administered medications for this visit.    Allergies  Allergen Reactions  . Gluten Meal Swelling    ?Angioedema  . Omnicef [Cefdinir] Swelling    Angioedema  . Pineapple Swelling  . Naproxen Rash    Face rash     Discussed warning signs or  symptoms. Please see discharge instructions. Patient expresses understanding.

## 2018-09-19 NOTE — Patient Instructions (Signed)
Thank you for coming in today. Take cetrizine (generic Zyrtec) twice daily for 1 week.  Take over the counter Pepcid (famotidine) twice daily for 1 week.  STOP doxycycline and Omnicef.  Start Clindamycin three times daily for 1 week.   Let me know if not getting better.    Angioedema Angioedema is the sudden swelling of tissue in the body. Angioedema can affect any part of the body, but it most often affects the deeper parts of the skin, causing red, itchy patches (hives) to appear over the affected area. It often begins during the night and is found in the morning. Depending on the cause, angioedema may happen:  Only once.  Several times. It may come back in unpredictable patterns.  Repeatedly for several years. Over time, it may gradually stop coming back.  Angioedema can be life-threatening if it affects the air passages that you breathe through. What are the causes? This condition may be caused by:  Foods, such as milk, eggs, shellfish, wheat, or nuts.  Certain medicines, such as ACE inhibitors, antibiotics, nonsteroidal anti-inflammatory drugs, birth control pills, or dyes used in X-rays.  Insect stings.  Infections.  Angioedema can be inherited, and episodes can be triggered by:  Mild injury.  Dental work.  Surgery.  Stress.  Sudden changes in temperature.  Exercise.  In some cases, the cause of this condition is not known. What are the signs or symptoms? Symptoms of this condition depend on where the swelling happens. Symptoms may include:  Swollen skin.  Red, itchy patches of skin (hives).  Redness in the affected area.  Pain in the affected area.  Swollen lips or tongue.  Wheezing.  Breathing problems.  If your internal organs are involved, symptoms may also include:  Nausea.  Abdominal pain.  Vomiting.  Difficulty swallowing.  Difficulty passing urine.  How is this diagnosed? This condition may be diagnosed based on:  An exam of  the affected area.  Your medical history.  Whether anyone in your family has had this condition before.  A review of any medicines you have been taking.  Tests, including: ? Allergy skin tests to see if the condition was caused by an allergic reaction. ? Blood tests to see if the condition was caused by a gene. ? Tests to check for underlying diseases that could cause the condition.  How is this treated? Treatment for this condition depends on the cause. It may involve any of the following:  If something triggered the condition, making changes to keep it from triggering the condition again.  If the condition affects your breathing, having tubes placed in your airway to keep it open.  Taking medicines to treat symptoms or prevent future episodes. These may include: ? Antihistamines. ? Epinephrine injections. ? Steroids.  If your condition is severe, you may need to be treated at the hospital. Angioedema usually gets better in 24-48 hours. Follow these instructions at home:  Take over-the-counter and prescription medicines only as told by your health care provider.  If you were given medicines for emergency allergy treatment, always carry them with you.  Wear a medical bracelet as told by your health care provider.  If something triggers your condition, avoid the trigger, if possible.  If your condition is inherited and you are thinking about having children, talk to your health care provider. It is important to discuss the risks of passing on the condition to your children. Contact a health care provider if:  You have repeated episodes of  angioedema.  Episodes of angioedema start to happen more often than they used to, even after you take steps to prevent them.  You have episodes of angioedema that are more severe than they have been before, even after you take steps to prevent them.  You are thinking about having children. Get help right away if:  You have severe  swelling of your mouth, tongue, or lips.  You have trouble breathing.  You have trouble swallowing.  You faint. This information is not intended to replace advice given to you by your health care provider. Make sure you discuss any questions you have with your health care provider. Document Released: 01/30/2002 Document Revised: 06/18/2016 Document Reviewed: 05/31/2016 Elsevier Interactive Patient Education  [employer].

## 2018-10-10 ENCOUNTER — Other Ambulatory Visit: Payer: Self-pay | Admitting: Physician Assistant

## 2018-10-10 DIAGNOSIS — E039 Hypothyroidism, unspecified: Secondary | ICD-10-CM

## 2018-10-22 ENCOUNTER — Other Ambulatory Visit: Payer: Self-pay

## 2018-10-22 DIAGNOSIS — F325 Major depressive disorder, single episode, in full remission: Secondary | ICD-10-CM

## 2018-10-22 DIAGNOSIS — M797 Fibromyalgia: Secondary | ICD-10-CM

## 2018-10-22 MED ORDER — DULOXETINE HCL 30 MG PO CPEP: 30.0000 mg | ORAL_CAPSULE | Freq: Every day | ORAL | 1 refills | Status: DC

## 2019-01-07 ENCOUNTER — Other Ambulatory Visit: Payer: Self-pay | Admitting: Physician Assistant

## 2019-01-07 DIAGNOSIS — M797 Fibromyalgia: Secondary | ICD-10-CM

## 2019-01-07 DIAGNOSIS — F325 Major depressive disorder, single episode, in full remission: Secondary | ICD-10-CM

## 2019-01-08 ENCOUNTER — Ambulatory Visit (INDEPENDENT_AMBULATORY_CARE_PROVIDER_SITE_OTHER): Payer: 59

## 2019-01-08 ENCOUNTER — Ambulatory Visit (INDEPENDENT_AMBULATORY_CARE_PROVIDER_SITE_OTHER): Payer: 59 | Admitting: Physician Assistant

## 2019-01-08 ENCOUNTER — Encounter: Payer: Self-pay | Admitting: Physician Assistant

## 2019-01-08 VITALS — BP 139/82 | HR 87 | Ht 63.0 in | Wt 190.0 lb

## 2019-01-08 DIAGNOSIS — M25512 Pain in left shoulder: Secondary | ICD-10-CM | POA: Diagnosis not present

## 2019-01-08 DIAGNOSIS — M5412 Radiculopathy, cervical region: Secondary | ICD-10-CM | POA: Diagnosis not present

## 2019-01-08 DIAGNOSIS — M542 Cervicalgia: Secondary | ICD-10-CM | POA: Diagnosis not present

## 2019-01-08 DIAGNOSIS — M797 Fibromyalgia: Secondary | ICD-10-CM | POA: Diagnosis not present

## 2019-01-08 DIAGNOSIS — M4802 Spinal stenosis, cervical region: Secondary | ICD-10-CM

## 2019-01-08 DIAGNOSIS — Z981 Arthrodesis status: Secondary | ICD-10-CM | POA: Insufficient documentation

## 2019-01-08 MED ORDER — PREDNISONE 50 MG PO TABS: ORAL_TABLET | ORAL | 0 refills | Status: DC

## 2019-01-08 MED ORDER — CELECOXIB 200 MG PO CAPS: 200.0000 mg | ORAL_CAPSULE | Freq: Two times a day (BID) | ORAL | 1 refills | Status: DC

## 2019-01-08 NOTE — Progress Notes (Signed)
Subjective:    Patient ID: Melinda Browning, female    DOB: 20-Mar-1966, 53 y.o.   MRN: [id number]  MWN:UUVO is a 53 yo female who presents to the clinic today with a chief complaint of left arm pain and upper back/neck pain x54months. She can remember a coworker joking around with her and pulling on her neck and this is when the pain started. She says the pain is intermittent and located in her triceps region, posterior shoulder, and forearm. She also complains of tingling in her middle and ring fingers on left hand. She has tried a tens unit, heat and these have helped some. 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 Patient had a cervical fusion she believed of C5/C6 in 2009.   .. Active Ambulatory Problems    Diagnosis Date Noted  . GERD (gastroesophageal reflux disease) 03/22/2018  . Hypothyroidism 03/22/2018  . Fibromyalgia 03/22/2018  . Depression, major, single episode, complete remission (HCC) 03/22/2018  . Class 1 obesity due to excess calories without serious comorbidity with body mass index (BMI) of 32.0 to 32.9 in adult 03/22/2018  . No energy 03/25/2018  . Thyroid nodule 08/03/2018  . Vitamin D insufficiency 08/06/2018  . Cervical radiculitis 01/08/2019  . Acute pain of left shoulder 01/08/2019  . History of fusion of cervical spine 01/08/2019   Resolved Ambulatory Problems    Diagnosis Date Noted  . No Resolved Ambulatory Problems   No Additional Past Medical History       Review of Systems  Musculoskeletal: Positive for arthralgias, neck pain and neck stiffness.  Skin: Negative for color change and wound.  Neurological: Negative for numbness.       Objective:   Physical Exam Vitals signs reviewed.  Constitutional:      General: She is not in acute distress.    Appearance: She is not ill-appearing.  HENT:     Head: Normocephalic and atraumatic.     Right Ear: Tympanic membrane, ear canal and external ear normal. There is no  impacted cerumen.     Left Ear: Tympanic membrane, ear canal and external ear normal. There is no impacted cerumen.     Nose: Nose normal. No congestion or rhinorrhea.     Mouth/Throat:     Mouth: Mucous membranes are moist.     Pharynx: Oropharynx is clear. No oropharyngeal exudate or posterior oropharyngeal erythema.  Eyes:     General: No scleral icterus.       Right eye: No discharge.        Left eye: No discharge.     Extraocular Movements: Extraocular movements intact.     Conjunctiva/sclera: Conjunctivae normal.     Pupils: Pupils are equal, round, and reactive to light.  Neck:     Musculoskeletal: Neck supple. Decreased range of motion. Pain with movement and muscular tenderness present. No neck rigidity.  Cardiovascular:     Rate and Rhythm: Normal rate.     Pulses: Normal pulses.     Heart sounds: Normal heart sounds.  Pulmonary:     Effort: Pulmonary effort is normal. No respiratory distress.     Breath sounds: Normal breath sounds. No wheezing, rhonchi or rales.  Abdominal:     General: Abdomen is flat. Bowel sounds are normal. There is no distension.     Tenderness: There is no abdominal tenderness. There is no guarding.  Musculoskeletal:     Left shoulder: She exhibits decreased range of  motion, tenderness, crepitus and pain. She exhibits no deformity, no laceration, no spasm, normal pulse and normal strength.     Comments: Left shoulder: 5/5 strength bilateral arms.  Hand grip 5/5.  No pain over cervical spin to palpation. Lots of paraspinal tenderness to the left upper back.  Decreased ROM due to pain. Pain relieved with abduction of left shoulder.  Some crepitus of left shoulder.   Lymphadenopathy:     Cervical: No cervical adenopathy.  Skin:    General: Skin is warm and dry.     Capillary Refill: Capillary refill takes less than 2 seconds.     Coloration: Skin is not jaundiced.     Findings: No bruising or rash.  Neurological:     General: No focal deficit  present.     Mental Status: She is alert and oriented to person, place, and time.     Coordination: Coordination normal.     Gait: Gait normal.  Psychiatric:        Mood and Affect: Mood normal.        Behavior: Behavior normal.        Thought Content: Thought content normal.           Assessment & Plan:  Marland KitchenMarland KitchenHaliegh was seen today for arm pain.  Diagnoses and all orders for this visit:  Cervical radiculitis -     celecoxib (CELEBREX) 200 MG capsule; Take 1 capsule (200 mg total) by mouth 2 (two) times daily. -     predniSONE (DELTASONE) 50 MG tablet; Take one tablet for 5 days. -     DG Cervical Spine Complete  Neck pain -     celecoxib (CELEBREX) 200 MG capsule; Take 1 capsule (200 mg total) by mouth 2 (two) times daily. -     predniSONE (DELTASONE) 50 MG tablet; Take one tablet for 5 days. -     DG Cervical Spine Complete  Acute pain of left shoulder -     celecoxib (CELEBREX) 200 MG capsule; Take 1 capsule (200 mg total) by mouth 2 (two) times daily. -     predniSONE (DELTASONE) 50 MG tablet; Take one tablet for 5 days. -     DG Shoulder Left  Fibromyalgia -     celecoxib (CELEBREX) 200 MG capsule; Take 1 capsule (200 mg total) by mouth 2 (two) times daily. -     predniSONE (DELTASONE) 50 MG tablet; Take one tablet for 5 days.  History of fusion of cervical spine -     celecoxib (CELEBREX) 200 MG capsule; Take 1 capsule (200 mg total) by mouth 2 (two) times daily.   Will get xray of neck and shoulder. Suspect more disc radiculopathy like C5/C6.   -Continue tens, heat, ice -Prednisone burst -Celebrex to start daily.  -exercise for home PT given.   If not improving follow up with sports medicine to consider MRI and further intervention.   Marland KitchenHarlon Flor PA-C, have reviewed and agree with the above documentation in it's entirety.

## 2019-01-09 ENCOUNTER — Telehealth: Payer: Self-pay

## 2019-01-09 DIAGNOSIS — M542 Cervicalgia: Secondary | ICD-10-CM | POA: Insufficient documentation

## 2019-01-09 NOTE — Progress Notes (Signed)
Call pt: you do have disc space narrowing at C6/7/4/5. I do think your pain is likely disc related. Treatment plan stays the same for now. Would you consider a few sessions of physical therapy to see if there is any benefit?

## 2019-01-09 NOTE — Progress Notes (Signed)
Referral placed.

## 2019-01-09 NOTE — Telephone Encounter (Signed)
Patient called today in need of a work extension note so she can return to work on Monday 01/14/2019 instead of returning to work tomorrow. PCP approved and signed note and patient will pick up at the front. No further questions or concerns at this time.

## 2019-01-09 NOTE — Progress Notes (Signed)
Call pt: no shoulder xray abnormality.

## 2019-01-09 NOTE — Addendum Note (Signed)
Addended by: Jomarie Longs on: 01/09/2019 07:33 PM   Modules accepted: Orders

## 2019-01-14 ENCOUNTER — Encounter: Payer: Self-pay | Admitting: Physical Therapy

## 2019-01-14 ENCOUNTER — Other Ambulatory Visit: Payer: Self-pay

## 2019-01-14 ENCOUNTER — Ambulatory Visit: Payer: 59 | Admitting: Physical Therapy

## 2019-01-14 DIAGNOSIS — M5412 Radiculopathy, cervical region: Secondary | ICD-10-CM

## 2019-01-14 DIAGNOSIS — R293 Abnormal posture: Secondary | ICD-10-CM | POA: Diagnosis not present

## 2019-01-14 NOTE — Therapy (Signed)
[facility] [address] [place], [place], [address] Phone: [phone]   Fax:  [phone]  Physical Therapy Evaluation  Patient Details  Name: Melinda Browning MRN: [id number] Date of Birth: 11-26-1966 Referring Provider (PT): Jomarie LongsBreeback, Jade L, [place]PA-C   Encounter Date: 01/14/2019  PT End of Session - 01/14/19 0809    Visit Number  1    Number of Visits  6    Date for PT Re-Evaluation  02/25/19    PT Start Time  0716    PT Stop Time  0812    PT Time Calculation (min)  56 min    Activity Tolerance  Patient tolerated treatment well    Behavior During Therapy  [facility] for tasks assessed/performed       History reviewed. No pertinent past medical history.  History reviewed. No pertinent surgical history.  There were no vitals filed for this visit.   Subjective Assessment - 01/14/19 0718    Subjective  Pt is a 53 y/o female who presents to OPPT for neck pain x 1 month.  Pt reports she's unsure of cause of injury.  Pt states Lt > Rt side pain.    Pertinent History  ACDF    Limitations  Lifting    Diagnostic tests  xrays: degenerative disc disease above/below fusion    Patient Stated Goals  improve pain    Currently in Pain?  Yes    Pain Score  4    up to 9/10; at best 3-4/10   Pain Location  Neck    Pain Orientation  Left;Right;Posterior    Pain Descriptors / Indicators  Constant;Tiring    Pain Type  Chronic pain;Acute pain    Pain Radiating Towards  bil UE (Lt>Rt)    Pain Onset  More than a month ago    Pain Frequency  Constant    Aggravating Factors   "it's constant"    Pain Relieving Factors  medication         [facility] PT Assessment - 01/14/19 0723      Assessment   Medical Diagnosis  M54.2 (ICD-10-CM) - Neck pain    Referring Provider (PT)  Caleen EssexBreeback, Lonna CobbJade L, PA-C    Onset Date/Surgical Date  --   1 month   Hand Dominance  Right    Next MD Visit  PRN    Prior Therapy  prior to ACDF (~ 10 years ago)      Precautions   Precautions  None      Restrictions   Weight Bearing Restrictions  No      Balance Screen   Has the patient fallen in the past 6 months  No    Has the patient had a decrease in activity level because of a fear of falling?   No    Is the patient reluctant to leave their home because of a fear of falling?   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    Leisure  spend time with grandchild; painting  Cognition   Overall Cognitive Status  Within Functional Limits for tasks assessed      Observation/Other Assessments   Focus on Therapeutic Outcomes (FOTO)   54 (36% limited; predicted 47% limited)      Posture/Postural Control   Posture/Postural Control  Postural limitations    Postural Limitations  Rounded Shoulders;Forward head;Increased thoracic kyphosis      ROM / Strength   AROM / PROM / Strength  AROM      AROM   Overall AROM Comments  pain with all motions    AROM Assessment Site  Cervical    Cervical Flexion  57    Cervical Extension  36    Cervical - Right Side Bend  34    Cervical - Left Side Bend  27    Cervical - Right Rotation  56    Cervical - Left Rotation  42      Palpation   Palpation comment  pt tender to light palpation of suboccipitals, cervical paraspinals, upper traps, levator scapulae and rhomboids (no reproduction of sypmtoms)      Special Tests    Special Tests  Cervical    Cervical Tests  Spurling's;Dictraction      Spurling's   Findings  Negative      Distraction Test   Findngs  Negative    Comment  reported it "feels good" but no change in symptoms                Objective  measurements completed on examination: See above findings.      [facility] Adult PT Treatment/Exercise - 01/14/19 0723      Exercises   Exercises  Neck      Neck Exercises: Seated   Shoulder Rolls  Backwards;5 reps    Other Seated Exercise  scapular retraction x 5 reps      Neck Exercises: Supine   Neck Retraction  5 reps;5 secs      Modalities   Modalities  Electrical Stimulation;Moist Heat      Moist Heat Therapy   Number Minutes Moist Heat  15 Minutes    Moist Heat Location  Cervical      Electrical Stimulation   Electrical Stimulation Location  neck    Electrical Stimulation Action  IFC    Electrical Stimulation Parameters  to tolerance x 15 min    Electrical Stimulation Goals  Pain      Neck Exercises: Stretches   Other Neck Stretches  mid doorway stretch 2x30 sec             PT Education - 01/14/19 0809    Education Details  HEP    Person(s) Educated  Patient    Methods  Explanation;Demonstration;Handout    Comprehension  Verbalized understanding;Returned demonstration;Need further instruction          PT Long Term Goals - 01/14/19 0825      PT LONG TERM GOAL #1   Title  independent with HEP    Status  New    Target Date  02/25/19      PT LONG TERM GOAL #2   Title  FOTO score improved to </= 47% limited for improved function    Status  New    Target Date  02/25/19      PT LONG TERM GOAL #3   Title  cervical rotation to Lt improved at least 5 degrees for improved function and decreased pain    Status  New    Target Date  02/25/19      PT LONG TERM GOAL #4   Title  perform cervical ROM without increase in pain for improved function    Status  New    Target Date  02/25/19      PT LONG TERM GOAL #5   Title  demonstrate proper lifting and work simulated tasks to decrease risk of reinjury    Status  New    Target Date  02/25/19             Plan - 01/14/19 0746    Clinical Impression Statement  Pt is a 53 y/o female who presents to OPPT  for neck pain x 1 month.  Pt with hx of ACDF ~ 10 years ago and reported no issues until ~1 month ago.  Pt demonstrates poor postural awareness, decreased ROM and active trigger points affecting functional mobility.  Pt will benefit from PT to address deficits listed.    History and Personal Factors relevant to plan of care:  ACDF, fibromyalgia    Clinical Presentation  Evolving    Clinical Presentation due to:  radiating symptoms into bil UEs    Clinical Decision Making  Moderate    Rehab Potential  Good    PT Frequency  1x / week   recommend 2x/wk; pt requesting 1x/wk due to finances   PT Duration  6 weeks    PT Treatment/Interventions  ADLs/Self Care Home Management;Cryotherapy;Traction;Ultrasound;Moist Heat;Electrical Stimulation;Iontophoresis 4mg /ml Dexamethasone;Neuromuscular re-education;Therapeutic exercise;Therapeutic activities;Patient/family education;Manual techniques;Dry needling;Passive range of motion    PT Next Visit Plan  review HEP, manual/modalities/DN PRN, consider taping for posture, posture exercises, ?traction    PT Home Exercise Plan  Access Code: [email or web address]     Consulted and Agree with Plan of Care  Patient       Patient will benefit from skilled therapeutic intervention in order to improve the following deficits and impairments:  Increased muscle spasms, Increased fascial restricitons, Pain, Postural dysfunction, Impaired UE functional use, Decreased range of motion, Decreased strength  Visit Diagnosis: Radiculopathy, cervical region - Plan: PT plan of care cert/re-cert  Abnormal posture - Plan: PT plan of care cert/re-cert     Problem List Patient Active Problem List   Diagnosis Date Noted  . Neck pain 01/09/2019  . Cervical radiculitis 01/08/2019  . Acute pain of left shoulder 01/08/2019  . History of fusion of cervical spine 01/08/2019  . Vitamin D insufficiency 08/06/2018  . Thyroid nodule 08/03/2018  . No energy 03/25/2018  . GERD (gastroesophageal  reflux disease) 03/22/2018  . Hypothyroidism 03/22/2018  . Fibromyalgia 03/22/2018  . Depression, major, single episode, complete remission (HCC) 03/22/2018  . Class 1 obesity due to excess calories without serious comorbidity with body mass index (BMI) of 32.0 to 32.9 in adult 03/22/2018      Clarita Crane, PT, DPT 01/14/19 8:28 AM     [facility] [address] [place], [place], [address] Phone: [phone]   Fax:  [phone]  Name: Melinda Browning MRN: [id number] Date of Birth: October 24, 1966

## 2019-01-14 NOTE — Patient Instructions (Signed)
Access Code: [email or web address]  URL: [email or web address]  Date: 01/14/2019  Prepared by: Moshe Cipro   Exercises  Supine Chin Tuck - 10 reps - 1 sets - 5 sec hold - 2x daily - 7x weekly  Seated Scapular Retraction - 10 reps - 1 sets - 5 sec hold - 2x daily - 7x weekly  Standing Backward Shoulder Rolls - 10 reps - 1 sets - 2x daily - 7x weekly  Doorway Pec Stretch at 90 Degrees Abduction - 3 reps - 1 sets - 30 sec hold - 1x daily - 7x weekly

## 2019-01-17 ENCOUNTER — Ambulatory Visit (INDEPENDENT_AMBULATORY_CARE_PROVIDER_SITE_OTHER): Payer: 59

## 2019-01-17 ENCOUNTER — Encounter: Payer: Self-pay | Admitting: Sports Medicine

## 2019-01-17 ENCOUNTER — Ambulatory Visit (INDEPENDENT_AMBULATORY_CARE_PROVIDER_SITE_OTHER): Payer: 59 | Admitting: Sports Medicine

## 2019-01-17 DIAGNOSIS — M47817 Spondylosis without myelopathy or radiculopathy, lumbosacral region: Secondary | ICD-10-CM

## 2019-01-17 DIAGNOSIS — M51369 Other intervertebral disc degeneration, lumbar region without mention of lumbar back pain or lower extremity pain: Secondary | ICD-10-CM | POA: Insufficient documentation

## 2019-01-17 DIAGNOSIS — M5136 Other intervertebral disc degeneration, lumbar region: Secondary | ICD-10-CM

## 2019-01-17 DIAGNOSIS — Z981 Arthrodesis status: Secondary | ICD-10-CM | POA: Diagnosis not present

## 2019-01-17 MED ORDER — CYCLOBENZAPRINE HCL 10 MG PO TABS: ORAL_TABLET | ORAL | 0 refills | Status: DC

## 2019-01-17 MED ORDER — PREDNISONE 10 MG (48) PO TBPK: ORAL_TABLET | Freq: Every day | ORAL | 0 refills | Status: DC

## 2019-01-17 NOTE — Progress Notes (Signed)
Subjective:    I'm seeing this patient as a consultation for: Melinda GawJade Breeback, PA-C  CC: Neck and back pain  HPI: This is a 53 year old female, she is post C5-C6 ACDF.  For the past several weeks to months she has had pain that she localizes in her neck, as well as in her low back without radiation.  Worse with sitting, flexion, Valsalva.  No bowel or bladder dysfunction, saddle numbness, constitutional symptoms, no progressive weakness, nothing overtly radicular.  I reviewed the past medical history, family history, social history, surgical history, and allergies today and no changes were needed.  Please see the problem list section below in epic for further details.  Past Medical History: No past medical history on file. Past Surgical History: No past surgical history on file. Social History: Social History   Socioeconomic History  . Marital status: Widowed    Spouse name: Not on file  . Number of children: Not on file  . Years of education: Not on file  . Highest education level: Not on file  Occupational History  . Not on file  Social Needs  . Financial resource strain: Not on file  . Food insecurity:    Worry: Not on file    Inability: Not on file  . Transportation needs:    Medical: Not on file    Non-medical: Not on file  Tobacco Use  . Smoking status: Never Smoker  . Smokeless tobacco: Never Used  Substance and Sexual Activity  . Alcohol use: Never    Frequency: Never  . Drug use: Never  . Sexual activity: Not Currently  Lifestyle  . Physical activity:    Days per week: Not on file    Minutes per session: Not on file  . Stress: Not on file  Relationships  . Social connections:    Talks on phone: Not on file    Gets together: Not on file    Attends religious service: Not on file    Active member of club or organization: Not on file    Attends meetings of clubs or organizations: Not on file    Relationship status: Not on file  Other Topics Concern  .  Not on file  Social History Narrative  . Not on file   Family History: Family History  Problem Relation Age of Onset  . Hypertension Mother   . Hypertension Sister   . Hypertension Brother    Allergies: Allergies  Allergen Reactions  . Gluten Meal Swelling    ?Angioedema  . Omnicef [Cefdinir] Swelling    Angioedema  . Pineapple Swelling  . Naproxen Rash    Face rash   Medications: See med rec.  Review of Systems: No headache, visual changes, nausea, vomiting, diarrhea, constipation, dizziness, abdominal pain, skin rash, fevers, chills, night sweats, weight loss, swollen lymph nodes, body aches, joint swelling, muscle aches, chest pain, shortness of breath, mood changes, visual or auditory hallucinations.   Objective:   General: Well Developed, well nourished, and in no acute distress.  Neuro:  Extra-ocular muscles intact, able to move all 4 extremities, sensation grossly intact.  Deep tendon reflexes tested were normal. Psych: Alert and oriented, mood congruent with affect. ENT:  Ears and nose appear unremarkable.  Hearing grossly normal. Neck: Unremarkable overall appearance, trachea midline.  No visible thyroid enlargement. Eyes: Conjunctivae and lids appear unremarkable.  Pupils equal and round. Skin: Warm and dry, no rashes noted.  Cardiovascular: Pulses palpable, no extremity edema. Neck: Negative spurling's Full  neck range of motion Grip strength and sensation normal in bilateral hands Strength good C4 to T1 distribution No sensory change to C4 to T1 Reflexes normal Back Exam:  Inspection: Unremarkable  Motion: Flexion 45 deg, Extension 45 deg, Side Bending to 45 deg bilaterally,  Rotation to 45 deg bilaterally  SLR laying: Negative  XSLR laying: Negative  Palpable tenderness: None. FABER: negative. Sensory change: Gross sensation intact to all lumbar and sacral dermatomes.  Reflexes: 2+ at both patellar tendons, 2+ at achilles tendons, Babinski's downgoing.    Strength at foot  Plantar-flexion: 5/5 Dorsi-flexion: 5/5 Eversion: 5/5 Inversion: 5/5  Leg strength  Quad: 5/5 Hamstring: 5/5 Hip flexor: 5/5 Hip abductors: 5/5  Gait unremarkable.  Impression and Recommendations:   This case required medical decision making of moderate complexity.  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 Adding home physical therapy, extended course of prednisone to a 12-day taper, continue Celebrex at 200 mg twice daily, adding Flexeril at bedtime. On physical therapy. Return to see me in 4 weeks, MRI for interventional planning if no better.  Lumbar degenerative disc disease X-rays. Prednisone as above, Celebrex, Flexeril. Home therapy. Return to see me in 4 weeks, MRI for interventional planning if no better, pain is axial and discogenic. ___________________________________________ Ihor Austinhomas J. Benjamin Stainhekkekandam, M.D., ABFM., CAQSM. Primary Care and Sports Medicine [facility]  Adjunct Professor of Family Medicine  [employer]

## 2019-01-17 NOTE — Assessment & Plan Note (Signed)
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 Adding home physical therapy, extended course of prednisone to a 12-day taper, continue Celebrex at 200 mg twice daily, adding Flexeril at bedtime. On physical therapy. Return to see me in 4 weeks, MRI for interventional planning if no better.

## 2019-01-17 NOTE — Assessment & Plan Note (Signed)
X-rays. Prednisone as above, Celebrex, Flexeril. Home therapy. Return to see me in 4 weeks, MRI for interventional planning if no better, pain is axial and discogenic.

## 2019-01-21 ENCOUNTER — Ambulatory Visit (INDEPENDENT_AMBULATORY_CARE_PROVIDER_SITE_OTHER): Payer: 59 | Admitting: Rehabilitative and Restorative Service Providers"

## 2019-01-21 ENCOUNTER — Telehealth: Payer: Self-pay | Admitting: Physician Assistant

## 2019-01-21 ENCOUNTER — Encounter: Payer: Self-pay | Admitting: Rehabilitative and Restorative Service Providers"

## 2019-01-21 DIAGNOSIS — R293 Abnormal posture: Secondary | ICD-10-CM

## 2019-01-21 DIAGNOSIS — M5412 Radiculopathy, cervical region: Secondary | ICD-10-CM

## 2019-01-21 NOTE — Patient Instructions (Signed)
Access Code: [email or web address]  URL: [email or web address]  Date: 01/21/2019  Prepared by: Corlis Leak   Exercises  Supine Chin Tuck - 10 reps - 1 sets - 5 sec hold - 2x daily - 7x weekly  Seated Scapular Retraction - 10 reps - 1 sets - 5 sec hold - 2x daily - 7x weekly  Standing Backward Shoulder Rolls - 10 reps - 1 sets - 2x daily - 7x weekly  Doorway Pec Stretch at 90 Degrees Abduction - 3 reps - 1 sets - 30 sec hold - 1x daily - 7x weekly  Seated Cervical Retraction - 10 reps - 1 sets - 3x daily - 7x weekly  Standing Scapular Retraction - 10 reps - 1 sets - 10 hold - 3x daily - 7x weekly  Shoulder External Rotation and Scapular Retraction - 10 reps - 1 sets - hold - 3x daily - 7x weekly  Standing Scapular Retraction in Abduction - 10 reps - 1 sets - 3x daily - 7x weekly  Doorway Pec Stretch at 60 Degrees Abduction - 3 reps - 1 sets - 3x daily - 7x weekly  Doorway Pec Stretch at 90 Degrees Abduction - 3 reps - 1 sets - 30 seconds hold - 3x daily - 7x weekly  Doorway Pec Stretch at 120 Degrees Abduction - 3 reps - 1 sets - 30 second hold hold - 3x daily - 7x weekly  Hooklying Transversus Abdominis Palpation - 10 reps - 1 sets - 10 sec hold - 3x daily - 7x weekly

## 2019-01-21 NOTE — Telephone Encounter (Signed)
PT requested for a letter to be put on light duty. She works 10 hour days and wants to be switched to 8 hour days. The request was for about 3 weeks  Please Advise.

## 2019-01-21 NOTE — Therapy (Addendum)
[facility] [address] [place], [place], [address] Phone: [phone]   Fax:  [phone]  Physical Therapy Treatment  Patient Details  Name: Melinda Browning MRN: [id number] Date of Birth: July 25, 1966 Referring Provider (PT): Donella Stade, [place]   Encounter Date: 01/21/2019  PT End of Session - 01/21/19 8242    Visit Number  2    Number of Visits  6    Date for PT Re-Evaluation  02/25/19    PT Start Time  3536    PT Stop Time  0816    PT Time Calculation (min)  59 min    Activity Tolerance  Patient tolerated treatment well       History reviewed. No pertinent past medical history.  History reviewed. No pertinent surgical history.  There were no vitals filed for this visit.  Subjective Assessment - 01/21/19 0722    Subjective  continued pain - has not worked since last Tuesday due to pain. She returns to work Midwife. She is now on increased dose of predisione.     Currently in Pain?  Yes    Pain Score  1     Pain Location  Neck    Pain Orientation  Left;Right;Posterior    Pain Descriptors / Indicators  Constant    Pain Type  Chronic pain;Acute pain    Pain Radiating Towards  bilat UE Lt and Rt     Pain Onset  More than a month ago    Pain Frequency  Constant                       [facility] Adult PT Treatment/Exercise - 01/21/19 0001      Neck Exercises: Standing   Neck Retraction  10 reps;10 secs    Other Standing Exercises  scap squeeze 10 sec x 10; L'sx 10; W's x 10 with noodle       Neck Exercises: Seated   Shoulder Rolls  Backwards;5 reps      Neck Exercises: Supine   Neck Retraction  5 reps;10 secs      Modalities   Modalities  Electrical Stimulation;Moist Heat      Moist Heat Therapy   Number Minutes Moist Heat  20 Minutes    Moist Heat Location  Cervical      Electrical Stimulation   Electrical Stimulation Location  cervical to upper trap bilat     Electrical Stimulation Action   IFC    Electrical Stimulation Parameters  to tolerance    Electrical Stimulation Goals  Pain;Tone      Manual Therapy   Manual therapy comments  pt supine hooklying     Joint Mobilization  CPA mobs upper thoracic and lower cervical spine     Soft tissue mobilization  deep tissue work ant/lat/post cervical musculature; upper trap bilat     Passive ROM  cervical lateral flexion and rotation through partial range       Neck Exercises: Stretches   Other Neck Stretches  3 position doorway stretch 2x30 sec each              PT Education - 01/21/19 0739    Education Details  HEP     Person(s) Educated  Patient    Methods  Explanation;Demonstration;Tactile cues;Verbal cues;Handout    Comprehension  Verbalized understanding;Returned demonstration;Verbal cues required;Tactile cues required          PT Long Term Goals - 01/14/19 0825  PT LONG TERM GOAL #1   Title  independent with HEP    Status  New    Target Date  02/25/19      PT LONG TERM GOAL #2   Title  FOTO score improved to </= 47% limited for improved function    Status  New    Target Date  02/25/19      PT LONG TERM GOAL #3   Title  cervical rotation to Lt improved at least 5 degrees for improved function and decreased pain    Status  New    Target Date  02/25/19      PT LONG TERM GOAL #4   Title  perform cervical ROM without increase in pain for improved function    Status  New    Target Date  02/25/19      PT LONG TERM GOAL #5   Title  demonstrate proper lifting and work simulated tasks to decrease risk of reinjury    Status  New    Target Date  02/25/19            Plan - 01/21/19 0722    Clinical Impression Statement  Conitnued pain and radicular symptoms - some decrease in intensity due to meds and being out of work. Patient returns to work tonight 10 hour shifts repetitive tasks. Continued postural changes and muscular tightness noted.     Rehab Potential  Good    PT Frequency  1x / week    recommend 2x/wk; pt requesting 1x/wk due to finances   PT Duration  6 weeks    PT Treatment/Interventions  ADLs/Self Care Home Management;Cryotherapy;Traction;Ultrasound;Moist Heat;Electrical Stimulation;Iontophoresis 31m/ml Dexamethasone;Neuromuscular re-education;Therapeutic exercise;Therapeutic activities;Patient/family education;Manual techniques;Dry needling;Passive range of motion    PT Next Visit Plan  review HEP, manual/modalities/DN PRN, consider taping for posture, posture exercises, ?traction - assess response to manual work     PT Home Exercise Plan  Access Code: [email or web address]     Consulted and Agree with Plan of Care  Patient       Patient will benefit from skilled therapeutic intervention in order to improve the following deficits and impairments:  Increased muscle spasms, Increased fascial restricitons, Pain, Postural dysfunction, Impaired UE functional use, Decreased range of motion, Decreased strength  Visit Diagnosis: Radiculopathy, cervical region  Abnormal posture     Problem List Patient Active Problem List   Diagnosis Date Noted  . Lumbar degenerative disc disease 01/17/2019  . History of C5-C6 cervical ACDF 01/08/2019  . Vitamin D insufficiency 08/06/2018  . Thyroid nodule 08/03/2018  . No energy 03/25/2018  . GERD (gastroesophageal reflux disease) 03/22/2018  . Hypothyroidism 03/22/2018  . Fibromyalgia 03/22/2018  . Depression, major, single episode[place] 03/22/2018  . Class 1 obesity due to excess calories without serious comorbidity with body mass index (BMI) of 32.0 to 32.9 in adult 03/22/2018    Lylie Blacklock PNilda SimmerPT, MPH  01/21/2019, 7:59 AM  [facility] [place] [address]Phone: [phone]  Fax:  [phone] Name: ECason DabneyMRN: [id number] of Birth: 404-Dec-1967  PHYSICAL THERAPY DISCHARGE SUMMARY  Visits from Start of Care: 2  Current  functional level related to goals / functional outcomes: See progress note for discharge status   Remaining deficits: Unknown    Education / Equipment: HEP  Plan: Patient agrees to discharge.  Patient goals were partially met. Patient is being discharged due to not returning since the last  visit.  ?????     Alasha Mcguinness P. Helene Kelp PT, MPH 02/07/19 10:03 AM

## 2019-01-24 NOTE — Telephone Encounter (Signed)
Letter has been printed, signed, and given to patient. No further questions or concerns at this time.

## 2019-01-28 ENCOUNTER — Encounter: Payer: 59 | Admitting: Rehabilitative and Restorative Service Providers"

## 2019-02-01 ENCOUNTER — Telehealth: Payer: Self-pay | Admitting: Physician Assistant

## 2019-02-01 ENCOUNTER — Ambulatory Visit (INDEPENDENT_AMBULATORY_CARE_PROVIDER_SITE_OTHER): Payer: 59 | Admitting: Sports Medicine

## 2019-02-01 ENCOUNTER — Encounter: Payer: Self-pay | Admitting: Sports Medicine

## 2019-02-01 DIAGNOSIS — M797 Fibromyalgia: Secondary | ICD-10-CM

## 2019-02-01 DIAGNOSIS — M4807 Spinal stenosis, lumbosacral region: Secondary | ICD-10-CM

## 2019-02-01 DIAGNOSIS — M51369 Other intervertebral disc degeneration, lumbar region without mention of lumbar back pain or lower extremity pain: Secondary | ICD-10-CM

## 2019-02-01 DIAGNOSIS — M5136 Other intervertebral disc degeneration, lumbar region: Secondary | ICD-10-CM

## 2019-02-01 DIAGNOSIS — Z981 Arthrodesis status: Secondary | ICD-10-CM | POA: Diagnosis not present

## 2019-02-01 DIAGNOSIS — F325 Major depressive disorder, single episode, in full remission: Secondary | ICD-10-CM | POA: Diagnosis not present

## 2019-02-01 MED ORDER — GABAPENTIN 300 MG PO CAPS: ORAL_CAPSULE | ORAL | 3 refills | Status: DC

## 2019-02-01 MED ORDER — DULOXETINE HCL 60 MG PO CPEP: 60.0000 mg | ORAL_CAPSULE | Freq: Every day | ORAL | 3 refills | Status: DC

## 2019-02-01 NOTE — Progress Notes (Addendum)
Subjective:    CC: Follow-up  HPI: This is a pleasant 53 year old female, she is back for follow-up of her neck and back pain.  Left cervical radiculitis: Left-sided with adjacent effusion, persistent pain in spite of steroids, muscle relaxers, physical therapy, gabapentin.  Left leg radiculitis: With axial discogenic back pain, persistent in spite of physical therapy, steroids, muscle relaxers, gabapentin.  No bowel or bladder dysfunction, saddle numbness, constitutional symptoms.  I reviewed the past medical history, family history, social history, surgical history, and allergies today and no changes were needed.  Please see the problem list section below in epic for further details.  Past Medical History: No past medical history on file. Past Surgical History: No past surgical history on file. Social History: Social History   Socioeconomic History  . Marital status: Widowed    Spouse name: Not on file  . Number of children: Not on file  . Years of education: Not on file  . Highest education level: Not on file  Occupational History  . Not on file  Social Needs  . Financial resource strain: Not on file  . Food insecurity:    Worry: Not on file    Inability: Not on file  . Transportation needs:    Medical: Not on file    Non-medical: Not on file  Tobacco Use  . Smoking status: Never Smoker  . Smokeless tobacco: Never Used  Substance and Sexual Activity  . Alcohol use: Never    Frequency: Never  . Drug use: Never  . Sexual activity: Not Currently  Lifestyle  . Physical activity:    Days per week: Not on file    Minutes per session: Not on file  . Stress: Not on file  Relationships  . Social connections:    Talks on phone: Not on file    Gets together: Not on file    Attends religious service: Not on file    Active member of club or organization: Not on file    Attends meetings of clubs or organizations: Not on file    Relationship status: Not on file  Other  Topics Concern  . Not on file  Social History Narrative  . Not on file   Family History: Family History  Problem Relation Age of Onset  . Hypertension Mother   . Hypertension Sister   . Hypertension Brother    Allergies: Allergies  Allergen Reactions  . Gluten Meal Swelling    ?Angioedema  . Omnicef [Cefdinir] Swelling    Angioedema  . Pineapple Swelling  . Naproxen Rash    Face rash   Medications: See med rec.  Review of Systems: No fevers, chills, night sweats, weight loss, chest pain, or shortness of breath.   Objective:    General: Well Developed, well nourished, and in no acute distress.  Neuro: Alert and oriented x3, extra-ocular muscles intact, sensation grossly intact.  HEENT: Normocephalic, atraumatic, pupils equal round reactive to light, neck supple, no masses, no lymphadenopathy, thyroid nonpalpable.  Skin: Warm and dry, no rashes. Cardiac: Regular rate and rhythm, no murmurs rubs or gallops, no lower extremity edema.  Respiratory: Clear to auscultation bilaterally. Not using accessory muscles, speaking in full sentences.  Impression and Recommendations:    History of C5-C6 cervical ACDF History of C5-C6 ACDF with adjacent level disease at C6-C7 and left C7 radiculitis. Unfortunately has failed physical therapy, steroids, Celebrex. Adding an MRI of the cervical spine for interventional planning. Increase gabapentin to 300 mg to 3 times  a day, and increasing Cymbalta to 60 mg. She has had greater than 6 weeks of conservative measures with symptoms and treatment starting in November 2019. Return to see me to go over MRI results.  Lumbar degenerative disc disease Persistent axial discogenic back pain with radiation down the left leg, to the back of the left calf, but not to the foot. Failed conservative measures, physical therapy, adding an MRI for interventional planning. ___________________________________________ Melinda Browning. Benjamin Stain, M.D., ABFM.,  CAQSM. Primary Care and Sports Medicine [facility]  Adjunct Professor of Family Medicine  [employer]

## 2019-02-01 NOTE — Assessment & Plan Note (Signed)
Persistent axial discogenic back pain with radiation down the left leg, to the back of the left calf, but not to the foot. Failed conservative measures, physical therapy, adding an MRI for interventional planning.

## 2019-02-01 NOTE — Assessment & Plan Note (Addendum)
History of C5-C6 ACDF with adjacent level disease at C6-C7 and left C7 radiculitis. Unfortunately has failed physical therapy, steroids, Celebrex. Adding an MRI of the cervical spine for interventional planning. Increase gabapentin to 300 mg to 3 times a day, and increasing Cymbalta to 60 mg. She has had greater than 6 weeks of conservative measures with symptoms and treatment starting in November 2019. Return to see me to go over MRI results.

## 2019-02-01 NOTE — Telephone Encounter (Signed)
Prior authorization obtained for MR Cervical spine  Auth# [id number].  Insurance denied MR Lumbar spine- given back to Dr. Kris Mouton LPN

## 2019-02-08 NOTE — Progress Notes (Signed)
Can we call patient and see if she is improving?

## 2019-02-13 ENCOUNTER — Telehealth: Payer: Self-pay | Admitting: Rehabilitative and Restorative Service Providers"

## 2019-02-13 NOTE — Telephone Encounter (Signed)
Follow up TC to patient to check on status. She continues to have pain that has not changed significantly. Patient does not wish to schedule additional PT at this time. She is awaiting MRI 02/17/2019 and will contact us to schedule as needed pending test results.  Mylez Venable P. Leonor Liv PT, MPH 02/13/19 2:35 PM

## 2019-02-17 ENCOUNTER — Ambulatory Visit (INDEPENDENT_AMBULATORY_CARE_PROVIDER_SITE_OTHER): Payer: 59

## 2019-02-17 ENCOUNTER — Other Ambulatory Visit: Payer: Self-pay

## 2019-02-17 DIAGNOSIS — Z981 Arthrodesis status: Secondary | ICD-10-CM

## 2019-02-17 DIAGNOSIS — M5136 Other intervertebral disc degeneration, lumbar region: Secondary | ICD-10-CM | POA: Diagnosis not present

## 2019-02-17 DIAGNOSIS — M5011 Cervical disc disorder with radiculopathy,  high cervical region: Secondary | ICD-10-CM | POA: Diagnosis not present

## 2019-02-17 DIAGNOSIS — M4807 Spinal stenosis, lumbosacral region: Secondary | ICD-10-CM | POA: Diagnosis not present

## 2019-03-07 ENCOUNTER — Encounter (INDEPENDENT_AMBULATORY_CARE_PROVIDER_SITE_OTHER): Payer: 59 | Admitting: Sports Medicine

## 2019-03-07 DIAGNOSIS — M797 Fibromyalgia: Secondary | ICD-10-CM

## 2019-03-07 DIAGNOSIS — M5136 Other intervertebral disc degeneration, lumbar region: Secondary | ICD-10-CM | POA: Diagnosis not present

## 2019-03-07 DIAGNOSIS — Z981 Arthrodesis status: Secondary | ICD-10-CM | POA: Diagnosis not present

## 2019-03-07 MED ORDER — GABAPENTIN 600 MG PO TABS: 600.0000 mg | ORAL_TABLET | Freq: Three times a day (TID) | ORAL | 3 refills | Status: DC

## 2019-03-07 NOTE — Addendum Note (Signed)
Addended by: Monica Becton on: 03/07/2019 10:53 AM   Modules accepted: Orders

## 2019-05-09 ENCOUNTER — Other Ambulatory Visit: Payer: Self-pay | Admitting: Physician Assistant

## 2019-05-09 DIAGNOSIS — F325 Major depressive disorder, single episode, in full remission: Secondary | ICD-10-CM

## 2019-05-09 DIAGNOSIS — M797 Fibromyalgia: Secondary | ICD-10-CM

## 2019-05-13 ENCOUNTER — Other Ambulatory Visit: Payer: Self-pay | Admitting: Sports Medicine

## 2019-05-13 DIAGNOSIS — M797 Fibromyalgia: Secondary | ICD-10-CM

## 2019-05-13 DIAGNOSIS — F325 Major depressive disorder, single episode, in full remission: Secondary | ICD-10-CM

## 2019-05-13 MED ORDER — DULOXETINE HCL 60 MG PO CPEP: 60.0000 mg | ORAL_CAPSULE | Freq: Every day | ORAL | 1 refills | Status: DC

## 2019-05-27 ENCOUNTER — Encounter: Payer: Self-pay | Admitting: Sports Medicine

## 2019-06-10 ENCOUNTER — Encounter: Payer: Self-pay | Admitting: Osteopathic Medicine

## 2019-06-10 ENCOUNTER — Telehealth: Payer: Self-pay | Admitting: Osteopathic Medicine

## 2019-06-10 ENCOUNTER — Ambulatory Visit (INDEPENDENT_AMBULATORY_CARE_PROVIDER_SITE_OTHER): Payer: 59 | Admitting: Osteopathic Medicine

## 2019-06-10 ENCOUNTER — Other Ambulatory Visit: Payer: Self-pay | Admitting: Sports Medicine

## 2019-06-10 ENCOUNTER — Other Ambulatory Visit: Payer: Self-pay

## 2019-06-10 VITALS — BP 147/92

## 2019-06-10 DIAGNOSIS — R197 Diarrhea, unspecified: Secondary | ICD-10-CM

## 2019-06-10 DIAGNOSIS — R5383 Other fatigue: Secondary | ICD-10-CM | POA: Diagnosis not present

## 2019-06-10 DIAGNOSIS — F325 Major depressive disorder, single episode, in full remission: Secondary | ICD-10-CM

## 2019-06-10 DIAGNOSIS — R52 Pain, unspecified: Secondary | ICD-10-CM | POA: Diagnosis not present

## 2019-06-10 DIAGNOSIS — M797 Fibromyalgia: Secondary | ICD-10-CM

## 2019-06-10 DIAGNOSIS — Z20822 Contact with and (suspected) exposure to covid-19: Secondary | ICD-10-CM

## 2019-06-10 DIAGNOSIS — Z20828 Contact with and (suspected) exposure to other viral communicable diseases: Secondary | ICD-10-CM

## 2019-06-10 NOTE — Progress Notes (Signed)
Virtual Visit via Video (App used: Doximity) Note  I connected with      Melinda PeekElina Browning on 06/10/19 at 10:00 by a telemedicine application and verified that I am speaking with the correct person using two identifiers.  Patient is t home I am in office   I discussed the limitations of evaluation and management by telemedicine and the availability of in person appointments. The patient expressed understanding and agreed to proceed.  History of Present Illness: Melinda Browning is a 53 y.o. female who would like to discuss feeling sick   2 days ago, started feeling a bit sick. BP was high at 150/90s, yesterday BP was up and down. Last night was very tiered, this morning having body aches. Took Ibuprofen 600 mg and Tylenol. Bit of diarrhea yesterday. Used her inhaler yesterday. Was unable to check temperature but feeling feverish yesterday. No breathing problems or cough now.      Observations/Objective: BP (!) 147/92 (Patient Position: Sitting, Cuff Size: Normal)  BP Readings from Last 3 Encounters:  06/10/19 (!) 147/92  02/01/19 134/84  01/17/19 (!) 153/90   Exam: Normal Speech.    Lab and Radiology Results No results found for this or any previous visit (from the past 72 hour(s)). No results found.     Assessment and Plan: 53 y.o. female with The encounter diagnosis was Suspected Covid-19 Virus Infection.  Pt advised stay home  Will set up for drive-through testing.   See pt instructions and letters     Follow Up Instructions: Return for RECHECK PENDING RESULTS / IF WORSE OR CHANGE.    I discussed the assessment and treatment plan with the patient. The patient was provided an opportunity to ask questions and all were answered. The patient agreed with the plan and demonstrated an understanding of the instructions.   The patient was advised to call back or seek an in-person evaluation if any new concerns, if symptoms worsen or if the condition fails to improve as  anticipated.  25 minutes of non-face-to-face time was provided during this encounter.                      Historical information moved to improve visibility of documentation.  No past medical history on file. No past surgical history on file. Social History   Tobacco Use  . Smoking status: Never Smoker  . Smokeless tobacco: Never Used  Substance Use Topics  . Alcohol use: Never    Frequency: Never   family history includes Hypertension in her brother, mother, and sister.  Medications: Current Outpatient Medications  Medication Sig Dispense Refill  . celecoxib (CELEBREX) 200 MG capsule Take 1 capsule (200 mg total) by mouth 2 (two) times daily. 60 capsule 1  . DULoxetine (CYMBALTA) 60 MG capsule Take 1 capsule (60 mg total) by mouth daily. 90 capsule 1  . gabapentin (NEURONTIN) 600 MG tablet Take 1 tablet (600 mg total) by mouth 3 (three) times daily. 90 tablet 3  . levothyroxine (SYNTHROID, LEVOTHROID) 100 MCG tablet TAKE 1 TABLET BY MOUTH ONCE DAILY BEFORE BREAKFAST 90 tablet 2  . triamcinolone cream (KENALOG) 0.5 % Apply 1 application topically 2 (two) times daily. To affected areas for itching 30 g 1  . clindamycin (CLEOCIN) 300 MG capsule Take 1 capsule (300 mg total) by mouth 3 (three) times daily. (Patient not taking: Reported on 06/10/2019) 21 capsule 0  . cyclobenzaprine (FLEXERIL) 10 MG tablet One half tab PO qHS, then increase gradually  to one tab TID. (Patient not taking: Reported on 06/10/2019) 30 tablet 0   No current facility-administered medications for this visit.    Allergies  Allergen Reactions  . Gluten Meal Swelling    ?Angioedema  . Omnicef [Cefdinir] Swelling    Angioedema  . Pineapple Swelling  . Naproxen Rash    Face rash    PDMP not reviewed this encounter. No orders of the defined types were placed in this encounter.  No orders of the defined types were placed in this encounter.

## 2019-06-10 NOTE — Telephone Encounter (Signed)
Scheduled patient for COVID 19 test tomorrow at 10 am at [facility].  Testing protocol reviewed with patient.

## 2019-06-10 NOTE — Patient Instructions (Signed)
COVID-19 Frequently Asked Questions °COVID-19 (coronavirus disease) is an infection that is caused by a large family of viruses. Some viruses cause illness in people and others cause illness in animals like camels, cats, and bats. In some cases, the viruses that cause illness in animals can spread to humans. °Where did the coronavirus come from? °In December 2019, [place] told the World Health Organization (WHO) of several cases of lung disease (human respiratory illness). These cases were linked to an open seafood and livestock market in the city of [place]. The link to the seafood and livestock market suggests that the virus may have spread from animals to humans. However, since that first outbreak in December, the virus has also been shown to spread from person to person. °What is the name of the disease and the virus? °Disease name °Early on, this disease was called novel coronavirus. This is because scientists determined that the disease was caused by a new (novel) respiratory virus. The World Health Organization (WHO) has now named the disease COVID-19, or coronavirus disease. °Virus name °The virus that causes the disease is called severe acute respiratory syndrome coronavirus 2 (SARS-CoV-2). °More information on disease and virus naming °World Health Organization (WHO): www.who.int/emergencies/diseases/novel-coronavirus-2019/technical-guidance/naming-the-coronavirus-disease-(covid-2019)-and-the-virus-that-causes-it °Who is at risk for complications from coronavirus disease? °Some people may be at higher risk for complications from coronavirus disease. This includes older adults and people who have chronic diseases, such as heart disease, diabetes, and lung disease. °If you are at higher risk for complications, take these extra precautions: °· Avoid close contact with people who are sick or have a fever or cough. Stay at least 3-6 ft (1-2 m) away from them, if possible. °· Wash your hands often with soap and  water for at least 20 seconds. °· Avoid touching your face, mouth, nose, or eyes. °· Keep supplies on hand at home, such as food, medicine, and cleaning supplies. °· Stay home as much as possible. °· Avoid social gatherings and travel. °How does coronavirus disease spread? °The virus that causes coronavirus disease spreads easily from person to person (is contagious). There are also cases of community-spread disease. This means the disease has spread to: °· People who have no known contact with other infected people. °· People who have not traveled to areas where there are known cases. °It appears to spread from one person to another through droplets from coughing or sneezing. °Can I get the virus from touching surfaces or objects? °There is still a lot that we do not know about the virus that causes coronavirus disease. Scientists are basing a lot of information on what they know about similar viruses, such as: °· Viruses cannot generally survive on surfaces for long. They need a human body (host) to survive. °· It is more likely that the virus is spread by close contact with people who are sick (direct contact), such as through: °? Shaking hands or hugging. °? Breathing in respiratory droplets that travel through the air. This can happen when an infected person coughs or sneezes on or near other people. °· It is less likely that the virus is spread when a person touches a surface or object that has the virus on it (indirect contact). The virus may be able to enter the body if the person touches a surface or object and then touches his or her face, eyes, nose, or mouth. °Can a person spread the virus without having symptoms of the disease? °It may be possible for the virus to spread before a person   has symptoms of the disease, but this is most likely not the main way the virus is spreading. It is more likely for the virus to spread by being in close contact with people who are sick and breathing in the respiratory  droplets of a sick person's cough or sneeze. °What are the symptoms of coronavirus disease? °Symptoms vary from person to person and can range from mild to severe. Symptoms may include: °· Fever. °· Cough. °· Tiredness, weakness, or fatigue. °· Fast breathing or feeling short of breath. °These symptoms can appear anywhere from 2 to 14 days after you have been exposed to the virus. If you develop symptoms, call your health care provider. People with severe symptoms may need hospital care. °If I am exposed to the virus, how long does it take before symptoms start? °Symptoms of coronavirus disease may appear anywhere from 2 to 14 days after a person has been exposed to the virus. If you develop symptoms, call your health care provider. °Should I be tested for this virus? °Your health care provider will decide whether to test you based on your symptoms, history of exposure, and your risk factors. °How does a health care provider test for this virus? °Health care providers will collect samples to send for testing. Samples may include: °· Taking a swab of fluid from the nose. °· Taking fluid from the lungs by having you cough up mucus (sputum) into a sterile cup. °· Taking a blood sample. °· Taking a stool or urine sample. °Is there a treatment or vaccine for this virus? °Currently, there is no vaccine to prevent coronavirus disease. Also, there are no medicines like antibiotics or antivirals to treat the virus. A person who becomes sick is given supportive care, which means rest and fluids. A person may also relieve his or her symptoms by using over-the-counter medicines that treat sneezing, coughing, and runny nose. These are the same medicines that a person takes for the common cold. °If you develop symptoms, call your health care provider. People with severe symptoms may need hospital care. °What can I do to protect myself and my family from this virus? ° °  ° °You can protect yourself and your family by taking the  same actions that you would take to prevent the spread of other viruses. Take the following actions: °· Wash your hands often with soap and water for at least 20 seconds. If soap and water are not available, use alcohol-based hand sanitizer. °· Avoid touching your face, mouth, nose, or eyes. °· Cough or sneeze into a tissue, sleeve, or elbow. Do not cough or sneeze into your hand or the air. °? If you cough or sneeze into a tissue, throw it away immediately and wash your hands. °· Disinfect objects and surfaces that you frequently touch every day. °· Avoid close contact with people who are sick or have a fever or cough. Stay at least 3-6 ft (1-2 m) away from them, if possible. °· Stay home if you are sick, except to get medical care. Call your health care provider before you get medical care. °· Make sure your vaccines are up to date. Ask your health care provider what vaccines you need. °What should I do if I need to travel? °Follow travel recommendations from your local health authority, the CDC, and WHO. °Travel information and advice °· Centers for Disease Control and Prevention (CDC): www.cdc.gov/coronavirus/2019-ncov/travelers/index.html °· World Health Organization (WHO): www.who.int/emergencies/diseases/novel-coronavirus-2019/travel-advice °Know the risks and take action to protect your health °·   You are at higher risk of getting coronavirus disease if you are traveling to areas with an outbreak or if you are exposed to travelers from areas with an outbreak. °· Wash your hands often and practice good hygiene to lower the risk of catching or spreading the virus. °What should I do if I am sick? °General instructions to stop the spread of infection °· Wash your hands often with soap and water for at least 20 seconds. If soap and water are not available, use alcohol-based hand sanitizer. °· Cough or sneeze into a tissue, sleeve, or elbow. Do not cough or sneeze into your hand or the air. °· If you cough or  sneeze into a tissue, throw it away immediately and wash your hands. °· Stay home unless you must get medical care. Call your health care provider or local health authority before you get medical care. °· Avoid public areas. Do not take public transportation, if possible. °· If you can, wear a mask if you must go out of the house or if you are in close contact with someone who is not sick. °Keep your home clean °· Disinfect objects and surfaces that are frequently touched every day. This may include: °? Counters and tables. °? Doorknobs and light switches. °? Sinks and faucets. °? Electronics such as phones, remote controls, keyboards, computers, and tablets. °· Wash dishes in hot, soapy water or use a dishwasher. Air-dry your dishes. °· Wash laundry in hot water. °Prevent infecting other household members °· Let healthy household members care for children and pets, if possible. If you have to care for children or pets, wash your hands often and wear a mask. °· Sleep in a different bedroom or bed, if possible. °· Do not share personal items, such as razors, toothbrushes, deodorant, combs, brushes, towels, and washcloths. °Where to find more information °Centers for Disease Control and Prevention (CDC) °· Information and news updates: www.cdc.gov/coronavirus/2019-ncov °World Health Organization (WHO) °· Information and news updates: www.who.int/emergencies/diseases/novel-coronavirus-2019 °· Coronavirus health topic: www.who.int/health-topics/coronavirus °· Questions and answers on COVID-19: www.who.int/news-room/q-a-detail/q-a-coronaviruses °· Global tracker: who.sprinklr.com °American Academy of Pediatrics (AAP) °· Information for families: www.healthychildren.org/English/health-issues/conditions/chest-lungs/Pages/2019-Novel-Coronavirus.aspx °The coronavirus situation is changing rapidly. Check your local health authority website or the CDC and WHO websites for updates and news. °When should I contact a health care  provider? °· Contact your health care provider if you have symptoms of an infection, such as fever or cough, and you: °? Have been near anyone who is known to have coronavirus disease. °? Have come into contact with a person who is suspected to have coronavirus disease. °? Have traveled outside of the country. °When should I get emergency medical care? °· Get help right away by calling your local emergency services (911 in the [place] if you have: °? Trouble breathing. °? Pain or pressure in your chest. °? Confusion. °? Blue-tinged lips and fingernails. °? Difficulty waking from sleep. °? Symptoms that get worse. °Let the emergency medical personnel know if you think you have coronavirus disease. °Summary °· A new respiratory virus is spreading from person to person and causing COVID-19 (coronavirus disease). °· The virus that causes COVID-19 appears to spread easily. It spreads from one person to another through droplets from coughing or sneezing. °· Older adults and those with chronic diseases are at higher risk of disease. If you are at higher risk for complications, take extra precautions. °· There is currently no vaccine to prevent coronavirus disease. There are no medicines, such as antibiotics or   antivirals, to treat the virus.  You can protect yourself and your family by washing your hands often, avoiding touching your face, and covering your coughs and sneezes. This information is not intended to replace advice given to you by your health care provider. Make sure you discuss any questions you have with your health care provider. Document Released: 03/19/2019 Document Revised: 03/19/2019 Document Reviewed: 03/19/2019 Elsevier Patient Education  2020 [employer]    Prevent the Spread of COVID-19 if You Are Sick If you are sick with COVID-19 or think you might have COVID-19, follow the steps below to help protect other people in your home and community. Stay home except to get medical care.  Stay  home. Most people with COVID-19 have mild illness and are able to recover at home without medical care. Do not leave your home, except to get medical care. Do not visit public areas.  Take care of yourself. Get rest and stay hydrated.  Get medical care when needed. Call your doctor before you go to their office for care. But, if you have trouble breathing or other concerning symptoms, call 911 for immediate help.  Avoid public transportation, ride-sharing, or taxis. Separate yourself from other people and pets in your home.  As much as possible, stay in a specific room and away from other people and pets in your home. Also, you should use a separate bathroom, if available. If you need to be around other people or animals in or outside of the home, wear a cloth face covering. ? See COVID-19 and Animals if you have questions about pets: RebateDates.com.brhttps://www.cdc.gov/coronavirus/2019-ncov/faq.html#COVID19animals Monitor your symptoms.  Common symptoms of COVID-19 include fever and cough. Trouble breathing is a more serious symptom that means you should get medical attention.  Follow care instructions from your healthcare provider and local health department. Your local health authorities will give instructions on checking your symptoms and reporting information. If you develop emergency warning signs for COVID-19 get medical attention immediately.  Emergency warning signs include*:  Trouble breathing  Persistent pain or pressure in the chest  New confusion or not able to be woken  Bluish lips or face *This list is not all inclusive. Please consult your medical provider for any other symptoms that are severe or concerning to you. Call 911 if you have a medical emergency. If you have a medical emergency and need to call 911, notify the operator that you have or think you might have, COVID-19. If possible, put on a facemask before medical help arrives. Call ahead before visiting your doctor.  Call  ahead. Many medical visits for routine care are being postponed or done by phone or telemedicine.  If you have a medical appointment that cannot be postponed, call your doctor's office. This will help the office protect themselves and other patients. If you are sick, wear a cloth covering over your nose and mouth.  You should wear a cloth face covering over your nose and mouth if you must be around other people or animals, including pets (even at home).  You don't need to wear the cloth face covering if you are alone. If you can't put on a cloth face covering (because of trouble breathing for example), cover your coughs and sneezes in some other way. Try to stay at least 6 feet away from other people. This will help protect the people around you. Note: During the COVID-19 pandemic, medical grade facemasks are reserved for healthcare workers and some first responders. You may need to  make a cloth face covering using a scarf or bandana. Cover your coughs and sneezes.  Cover your mouth and nose with a tissue when you cough or sneeze.  Throw used tissues in a lined trash can.  Immediately wash your hands with soap and water for at least 20 seconds. If soap and water are not available, clean your hands with an alcohol-based hand sanitizer that contains at least 60% alcohol. Clean your hands often.  Wash your hands often with soap and water for at least 20 seconds. This is especially important after blowing your nose, coughing, or sneezing; going to the bathroom; and before eating or preparing food.  Use hand sanitizer if soap and water are not available. Use an alcohol-based hand sanitizer with at least 60% alcohol, covering all surfaces of your hands and rubbing them together until they feel dry.  Soap and water are the best option, especially if your hands are visibly dirty.  Avoid touching your eyes, nose, and mouth with unwashed hands. Avoid sharing personal household items.  Do not share  dishes, drinking glasses, cups, eating utensils, towels, or bedding with other people in your home.  Wash these items thoroughly after using them with soap and water or put them in the dishwasher. Clean all "high-touch" surfaces everyday.  Clean and disinfect high-touch surfaces in your "sick room" and bathroom. Let someone else clean and disinfect surfaces in common areas, but not your bedroom and bathroom.  If a caregiver or other person needs to clean and disinfect a sick person's bedroom or bathroom, they should do so on an as-needed basis. The caregiver/other person should wear a mask and wait as long as possible after the sick person has used the bathroom. High-touch surfaces include phones, remote controls, counters, tabletops, doorknobs, bathroom fixtures, toilets, keyboards, tablets, and bedside tables.  Clean and disinfect areas that may have blood, stool, or body fluids on them.  Use household cleaners and disinfectants. Clean the area or item with soap and water or another detergent if it is dirty. Then use a household disinfectant. ? Be sure to follow the instructions on the label to ensure safe and effective use of the product. Many products recommend keeping the surface wet for several minutes to ensure germs are killed. Many also recommend precautions such as wearing gloves and making sure you have good ventilation during use of the product. ? Most EPA-registered household disinfectants should be effective. How to discontinue home isolation  People with COVID-19 who have stayed home (home isolated) can stop home isolation under the following conditions: ? If you will not have a test to determine if you are still contagious, you can leave home after these three things have happened:  You have had no fever for at least 72 hours (that is three full days of no fever without the use of medicine that reduces fevers) AND  other symptoms have improved (for example, when your cough or  shortness of breath has improved) AND  at least 10 days have passed since your symptoms first appeared. ? If you will be tested to determine if you are still contagious, you can leave home after these three things have happened:  You no longer have a fever (without the use of medicine that reduces fevers) AND  other symptoms have improved (for example, when your cough or shortness of breath has improved) AND  you received two negative tests in a row, 24 hours apart. Your doctor will follow CDC guidelines. In all cases,  follow the guidance of your healthcare provider and local health department. The decision to stop home isolation should be made in consultation with your healthcare provider and state and local health departments. Local decisions depend on local circumstances. [email or web address] 04/07/2019 This information is not intended to replace advice given to you by your health care provider. Make sure you discuss any questions you have with your health care provider. Document Released: 03/19/2019 Document Revised: 04/17/2019 Document Reviewed: 03/19/2019 Elsevier Patient Education  [place].

## 2019-06-10 NOTE — Telephone Encounter (Signed)
Suspected COVID, can we call and set her up for drive-through testing? Thanks!

## 2019-06-11 ENCOUNTER — Other Ambulatory Visit: Payer: 59

## 2019-06-11 DIAGNOSIS — Z20822 Contact with and (suspected) exposure to covid-19: Secondary | ICD-10-CM

## 2019-06-12 ENCOUNTER — Telehealth: Payer: Self-pay

## 2019-06-12 NOTE — Telephone Encounter (Signed)
I'm not able to say exactly why she's having this pain. Would strongly consider ibuprofen up to 800 mg 4 times per day plus tylenol 1000 mg 4 times per day, heat or ice is ok

## 2019-06-12 NOTE — Telephone Encounter (Signed)
Pt called stating she had COVID-19 testing on 06/10/19. She wanted provider to be aware she still continues to have severe back pain in the kidney area. Requesting a recommendation from provider. Routing to covering provider. Pls advise, thanks.

## 2019-06-13 MED ORDER — IBUPROFEN 800 MG PO TABS: 800.0000 mg | ORAL_TABLET | Freq: Three times a day (TID) | ORAL | 0 refills | Status: DC | PRN

## 2019-06-13 MED ORDER — CYCLOBENZAPRINE HCL 10 MG PO TABS: 5.0000 mg | ORAL_TABLET | Freq: Three times a day (TID) | ORAL | 0 refills | Status: DC | PRN

## 2019-06-13 NOTE — Telephone Encounter (Signed)
Pt is requesting a rx for ibuprofen 800 mg to be sent to [facility] in [facility]. She is also requesting a refill for cyclobenzaprine, if possible. Pls advise, thanks.

## 2019-06-13 NOTE — Telephone Encounter (Signed)
OK sent I saw she has a previous reaction of rash when she takes naproxen, would use caution with ibuprofen as these medications are similar

## 2019-06-13 NOTE — Telephone Encounter (Signed)
See below

## 2019-06-14 LAB — NOVEL CORONAVIRUS, NAA: SARS-CoV-2, NAA: NOT DETECTED

## 2019-06-14 NOTE — Telephone Encounter (Signed)
Pt is still having pain. She started taking pain medication as of last night. As per pt, she will need a letter for work regarding negative COVID-19. As per pt, she would like to take the 10 days + 3 days to make sure it was not a false negative since she is still feeling sick. Letter can be sent to MyChart. Thanks.

## 2019-06-14 NOTE — Telephone Encounter (Signed)
I sent her letter via mychrt

## 2019-06-14 NOTE — Telephone Encounter (Signed)
Noted  

## 2019-06-25 ENCOUNTER — Telehealth: Payer: Self-pay

## 2019-06-25 NOTE — Telephone Encounter (Signed)
Left voicemail with information below. Let patient know to call us back to schedule an office visit with her PCP.

## 2019-06-25 NOTE — Telephone Encounter (Signed)
Pt left a vm msg requesting a hospital f/u appt. She requested an appt w/ Dr. Sheppard Coil. As per Dr. Sheppard Coil, pls have pt schedule her appt with her provider - [facility] for f/u thanks.

## 2019-06-27 ENCOUNTER — Ambulatory Visit (INDEPENDENT_AMBULATORY_CARE_PROVIDER_SITE_OTHER): Payer: 59 | Admitting: Physician Assistant

## 2019-06-27 VITALS — Ht 63.0 in | Wt 190.0 lb

## 2019-06-27 DIAGNOSIS — R05 Cough: Secondary | ICD-10-CM | POA: Diagnosis not present

## 2019-06-27 DIAGNOSIS — R9389 Abnormal findings on diagnostic imaging of other specified body structures: Secondary | ICD-10-CM | POA: Diagnosis not present

## 2019-06-27 DIAGNOSIS — R0602 Shortness of breath: Secondary | ICD-10-CM | POA: Diagnosis not present

## 2019-06-27 DIAGNOSIS — R059 Cough, unspecified: Secondary | ICD-10-CM

## 2019-06-27 MED ORDER — PREDNISONE 50 MG PO TABS: ORAL_TABLET | ORAL | 0 refills | Status: DC

## 2019-06-27 NOTE — Progress Notes (Signed)
Discuss CT chest results from hospital.  No symptoms.

## 2019-06-27 NOTE — Progress Notes (Signed)
Patient ID: Melinda Browning, female   DOB: Jan 26, 1966, 53 y.o.   MRN: [id number] .Marland KitchenVirtual Visit via Video Note  I connected with Reine Just on 07/02/19 at  1:40 PM EDT by a video enabled telemedicine application and verified that I am speaking with the correct person using two identifiers.  Location: Patient: home Provider: clinic   I discussed the limitations of evaluation and management by telemedicine and the availability of in person appointments. The patient expressed understanding and agreed to proceed.  History of Present Illness: Pt is a 53 yo female who calls into the clinic to discuss SOB and cough. She was seen virtually on 7/6 with Dr. Sheppard Coil and placed on suspected covid protocol. Her COVID test was negative. She self isolated and symptomatic treated until 7/12 she went to ED for SOB. CBC, troponin, BNP, TSH, CMP all normal. CTA done and showed:   1. No evidence of acute pulmonary emboli. 2. 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 3. Hepatic steatosis.                                                                                                        She did have asthma when she was a child. Albuterol does help.   Denies any GERD symptoms. Does not notice cough worse after eating. She does notice it worse at night.   .. Active Ambulatory Problems    Diagnosis Date Noted  . GERD (gastroesophageal reflux disease) 03/22/2018  . Hypothyroidism 03/22/2018  . Fibromyalgia 03/22/2018  . Depression, major, single episode, complete remission ([place]) 03/22/2018  . Class 1 obesity due to excess calories without serious comorbidity with body mass index (BMI) of 32.0 to 32.9 in adult 03/22/2018  . No energy 03/25/2018  . Thyroid nodule 08/03/2018  . Vitamin D insufficiency 08/06/2018  . History of C5-C6 cervical ACDF 01/08/2019  . Lumbar degenerative disc disease 01/17/2019  . SOB (shortness of breath) 07/02/2019  .  Cough 07/02/2019  . Abnormal computed tomography angiography (CTA) 07/02/2019   Resolved Ambulatory Problems    Diagnosis Date Noted  . Acute pain of left shoulder 01/08/2019  . History of fusion of cervical spine 01/08/2019  . Neck pain 01/09/2019   No Additional Past Medical History   Reviewed med, allergy, problem list.    Observations/Objective: No acute distress. Normal breathing. No coughing.   .. Today's Vitals   06/27/19 1133  Weight: 190 lb (86.2 kg)  Height: 5\' 3"  (1.6 m)   Body mass index is 33.66 kg/m.    Assessment and Plan:  Marland KitchenMarland KitchenNasirah was seen today for hospitalization follow-up.  Diagnoses and all orders for this visit:  SOB (shortness of breath) -     predniSONE (DELTASONE) 50 MG tablet; Take one tablet for 5 days.  Cough -     predniSONE (DELTASONE) 50 MG tablet; Take one tablet for 5 days.  Abnormal computed tomography angiography (CTA)   Discussed CTA results. I will send msg to pulmonology and see what they think of the narrow trachea  and left subclavian artery.   Symptoms seem consistent with asthma/reactive airway especially since albuterol helps a lot. Prednisone burst given for 5 days. Continue using as needed albuterol.   For SOB come in for spirometry.    Follow Up Instructions:    I discussed the assessment and treatment plan with the patient. The patient was provided an opportunity to ask questions and all were answered. The patient agreed with the plan and demonstrated an understanding of the instructions.   The patient was advised to call back or seek an in-person evaluation if the symptoms worsen or if the condition fails to improve as anticipated.   Tandy GawJade Breeback, PA-C

## 2019-07-02 ENCOUNTER — Other Ambulatory Visit: Payer: 59

## 2019-07-02 ENCOUNTER — Telehealth: Payer: Self-pay | Admitting: Neurology

## 2019-07-02 ENCOUNTER — Encounter: Payer: Self-pay | Admitting: Physician Assistant

## 2019-07-02 DIAGNOSIS — R05 Cough: Secondary | ICD-10-CM

## 2019-07-02 DIAGNOSIS — R059 Cough, unspecified: Secondary | ICD-10-CM | POA: Insufficient documentation

## 2019-07-02 DIAGNOSIS — R0602 Shortness of breath: Secondary | ICD-10-CM | POA: Insufficient documentation

## 2019-07-02 DIAGNOSIS — R9389 Abnormal findings on diagnostic imaging of other specified body structures: Secondary | ICD-10-CM | POA: Insufficient documentation

## 2019-07-02 NOTE — Telephone Encounter (Signed)
I have already cancelled appt that was scheduled for today

## 2019-07-02 NOTE — Telephone Encounter (Signed)
Patient left a vm stating her grandson who lives with her is experiencing Covid symptoms. She has spirometry scheduled for this afternoon as a nurse visit. Please contact her to reschedule nurse visit.

## 2019-07-02 NOTE — Telephone Encounter (Signed)
Jade spoke with Dr. Melvyn Novas about patient and she actually needs more specific testing. We are sending directly to pulmonology. Patient made aware and is agreeable. Referral enterered.

## 2019-07-12 ENCOUNTER — Telehealth: Payer: Self-pay | Admitting: Neurology

## 2019-07-12 ENCOUNTER — Other Ambulatory Visit: Payer: Self-pay | Admitting: Sports Medicine

## 2019-07-12 DIAGNOSIS — M797 Fibromyalgia: Secondary | ICD-10-CM

## 2019-07-12 DIAGNOSIS — F325 Major depressive disorder, single episode, in full remission: Secondary | ICD-10-CM

## 2019-07-12 NOTE — Telephone Encounter (Signed)
We mainly were using cymbalta for your fibromyalgia and mood. Have you ever tried lyrica?

## 2019-07-12 NOTE — Telephone Encounter (Signed)
Patient left vm stating she believes Cymbalta is causing issues with fatigue and inability to sleep. She quit taking two days ago and feels better. Wants to discuss alternative medication. Please advise.

## 2019-07-12 NOTE — Telephone Encounter (Signed)
To PCP

## 2019-07-12 NOTE — Telephone Encounter (Signed)
Tried to call patient to inquire. NO answer. VM full.

## 2019-07-13 ENCOUNTER — Other Ambulatory Visit: Payer: Self-pay | Admitting: Sports Medicine

## 2019-07-13 DIAGNOSIS — M797 Fibromyalgia: Secondary | ICD-10-CM

## 2019-07-13 DIAGNOSIS — F325 Major depressive disorder, single episode, in full remission: Secondary | ICD-10-CM

## 2019-07-24 ENCOUNTER — Other Ambulatory Visit: Payer: Self-pay

## 2019-07-24 ENCOUNTER — Encounter: Payer: Self-pay | Admitting: Physician Assistant

## 2019-07-24 ENCOUNTER — Ambulatory Visit (INDEPENDENT_AMBULATORY_CARE_PROVIDER_SITE_OTHER): Payer: 59 | Admitting: Physician Assistant

## 2019-07-24 VITALS — BP 141/93 | HR 80 | Temp 98.1°F | Ht 63.0 in | Wt 195.0 lb

## 2019-07-24 DIAGNOSIS — M797 Fibromyalgia: Secondary | ICD-10-CM

## 2019-07-24 DIAGNOSIS — F3341 Major depressive disorder, recurrent, in partial remission: Secondary | ICD-10-CM

## 2019-07-24 MED ORDER — DULOXETINE HCL 30 MG PO CPEP: 30.0000 mg | ORAL_CAPSULE | Freq: Every day | ORAL | 0 refills | Status: DC

## 2019-07-24 MED ORDER — TRAMADOL HCL 50 MG PO TABS: 50.0000 mg | ORAL_TABLET | Freq: Two times a day (BID) | ORAL | 0 refills | Status: DC

## 2019-07-24 MED ORDER — VORTIOXETINE HBR 5 MG PO TABS: 5.0000 mg | ORAL_TABLET | Freq: Every day | ORAL | 1 refills | Status: DC

## 2019-07-24 NOTE — Progress Notes (Signed)
Subjective:    Patient ID: Melinda Browning, female    DOB: 22-Oct-1966, 53 y.o.   MRN: [id number]  HPI  Pt is a 53 yo female with fibromyalgia, MDD, lumbar DDD, hypothyroidism, cervical DDD who presents to the clinic for follow up.   She feels like cymbalta is making her more tired. It was increased by Dr. Darene Lamer and seemed to feel worse. Her pain is better but fatigue is much worse. She continues on gabaptentin. She could not tolerate lyrica due to swelling. She is on celebrex. She feels like her mood is worsening because of her pain.   .. Active Ambulatory Problems    Diagnosis Date Noted  . GERD (gastroesophageal reflux disease) 03/22/2018  . Hypothyroidism 03/22/2018  . Fibromyalgia 03/22/2018  . Depression, major, single episode, complete remission ([place]) 03/22/2018  . Class 1 obesity due to excess calories without serious comorbidity with body mass index (BMI) of 32.0 to 32.9 in adult 03/22/2018  . No energy 03/25/2018  . Thyroid nodule 08/03/2018  . Vitamin D insufficiency 08/06/2018  . History of C5-C6 cervical ACDF 01/08/2019  . Lumbar degenerative disc disease 01/17/2019  . SOB (shortness of breath) 07/02/2019  . Cough 07/02/2019  . Abnormal computed tomography angiography (CTA) 07/02/2019   Resolved Ambulatory Problems    Diagnosis Date Noted  . Acute pain of left shoulder 01/08/2019  . History of fusion of cervical spine 01/08/2019  . Neck pain 01/09/2019   Past Medical History:  Diagnosis Date  . DDD (degenerative disc disease), lumbar   . Depression      Review of Systems See HPI>     Objective:   Physical Exam Vitals signs reviewed.  Constitutional:      Appearance: Normal appearance.  Cardiovascular:     Rate and Rhythm: Normal rate and regular rhythm.  Pulmonary:     Breath sounds: Normal breath sounds.  Neurological:     General: No focal deficit present.     Mental Status: She is alert and oriented to person, place, and time.  Psychiatric:        Mood  and Affect: Mood normal.        Behavior: Behavior normal.           Assessment & Plan:  Marland KitchenMarland KitchenYeva was seen today for fibromyalgia.  Diagnoses and all orders for this visit:  Fibromyalgia -     vortioxetine HBr (TRINTELLIX) 5 MG TABS tablet; Take 1 tablet (5 mg total) by mouth daily.  Recurrent major depressive disorder, in partial remission (HCC) -     vortioxetine HBr (TRINTELLIX) 5 MG TABS tablet; Take 1 tablet (5 mg total) by mouth daily.  Other orders -     Discontinue: traMADol (ULTRAM) 50 MG tablet; Take 1 tablet (50 mg total) by mouth 2 (two) times daily for 5 days. -     DULoxetine (CYMBALTA) 30 MG capsule; Take 1 capsule (30 mg total) by mouth daily. To taper off.  .. Depression screen [facility] 2/9 07/24/2019 01/08/2019 08/06/2018 03/22/2018  Decreased Interest 2 0 1 0  Down, Depressed, Hopeless 0 0 0 0  PHQ - 2 Score 2 0 1 0  Altered sleeping 3 3 0 0  Tired, decreased energy 1 3 3 1   Change in appetite 1 0 1 0  Feeling bad or failure about yourself  0 0 0 0  Trouble concentrating 2 0 1 2  Moving slowly or fidgety/restless 1 0 0 0  Suicidal thoughts 0 0 0 0  PHQ-9 Score 10 6 6 3   Difficult doing work/chores Somewhat difficult Not difficult at all Somewhat difficult Somewhat difficult   .[name]. GAD 7 : Generalized Anxiety Score 07/24/2019 01/08/2019 03/22/2018  Nervous, Anxious, on Edge 1 0 0  Control/stop worrying 0 0 0  Worry too much - different things 0 0 0  Trouble relaxing 1 0 0  Restless 1 0 0  Easily annoyed or irritable 1 0 1  Afraid - awful might happen 0 0 0  Total GAD 7 Score 4 0 1  Anxiety Difficulty Somewhat difficult - Somewhat difficult     Discussed fibromyalgia/pain/depression and how they are linked.  Stay on gabapentin/celebrex. Will taper off cymbalta. Will start trintellix. Discussed anti-inflammatory diet. Discussed walking every day. Encouraged patient to look for less physical active job. Tramadol given for as needed pain. Discussed abuse potential.    Follow up in 3 months.

## 2019-07-25 ENCOUNTER — Telehealth: Payer: Self-pay | Admitting: Neurology

## 2019-07-25 ENCOUNTER — Telehealth: Payer: Self-pay | Admitting: Physician Assistant

## 2019-07-25 MED ORDER — TRAMADOL HCL 50 MG PO TABS: 50.0000 mg | ORAL_TABLET | Freq: Two times a day (BID) | ORAL | 0 refills | Status: AC

## 2019-07-25 NOTE — Telephone Encounter (Signed)
Ok I took the 5 days off. I did not know was on there.

## 2019-07-25 NOTE — Telephone Encounter (Signed)
Received fax from [employer] that Trintellix requires a PA. Information has been sent to the insurance company. Awaiting determination.   

## 2019-07-25 NOTE — Telephone Encounter (Signed)
[place] left vm asking if tramadol should be filled for 30 or 5 days. The quantity states #60 but directions say to take for 5 days. Please advise.

## 2019-07-26 NOTE — Telephone Encounter (Signed)
Received fax from [employer] that Trintellix was approved from 07/25/2019 through 07/24/2020. Pharmacy notified and forms sent to scan.  Patient will call pharmacy to let them know to fill the medication.

## 2019-07-29 ENCOUNTER — Encounter: Payer: Self-pay | Admitting: Physician Assistant

## 2019-08-01 ENCOUNTER — Telehealth: Payer: Self-pay | Admitting: Neurology

## 2019-08-01 NOTE — Telephone Encounter (Signed)
Received vm from patient confused about Cymbalta. She looked it up and saw it was for depression, but she was given RX for fibromyalgia and back pain.   Tried to call patient back and received vm. Left message letting her know Cymbalta can be used to treat depression but also pain. She was encouraged to call back with any further questions.

## 2019-08-02 NOTE — Telephone Encounter (Signed)
Patient called back.   She states she was actually calling about the Trintellix, not Cymbalta. She is confused about use. She states she was given it for Fibromyalgia and pain.   Does this work the same way as Cymbalta?

## 2019-08-02 NOTE — Telephone Encounter (Signed)
Called back and [facility] letting patient know. She is to call back with any questions.

## 2019-08-02 NOTE — Telephone Encounter (Signed)
Yes it is for fibromyalgia and pain.

## 2019-08-05 ENCOUNTER — Ambulatory Visit (INDEPENDENT_AMBULATORY_CARE_PROVIDER_SITE_OTHER): Payer: 59 | Admitting: Physician Assistant

## 2019-08-05 ENCOUNTER — Encounter: Payer: Self-pay | Admitting: Physician Assistant

## 2019-08-05 VITALS — Ht 63.0 in | Wt 195.0 lb

## 2019-08-05 DIAGNOSIS — M5136 Other intervertebral disc degeneration, lumbar region: Secondary | ICD-10-CM

## 2019-08-05 DIAGNOSIS — M797 Fibromyalgia: Secondary | ICD-10-CM | POA: Diagnosis not present

## 2019-08-05 DIAGNOSIS — Z981 Arthrodesis status: Secondary | ICD-10-CM | POA: Diagnosis not present

## 2019-08-05 MED ORDER — DULOXETINE HCL 30 MG PO CPEP: 30.0000 mg | ORAL_CAPSULE | Freq: Every day | ORAL | 1 refills | Status: DC

## 2019-08-05 NOTE — Progress Notes (Signed)
Patient ID: Melinda Browning, female   DOB: [birth year], 53 y.o.   MRN: [id number] .Marland KitchenVirtual Visit via Telephone Note  I connected with Melinda Browning on 08/06/19 at  2:00 PM EDT by telephone and verified that I am speaking with the correct person using two identifiers.  Location: Patient: home Provider: clinic   I discussed the limitations, risks, security and privacy concerns of performing an evaluation and management service by telephone and the availability of in person appointments. I also discussed with the patient that there may be a patient responsible charge related to this service. The patient expressed understanding and agreed to proceed.   History of Present Illness: Pt is a 53 yo female with chronic pain due to lumbar DDD, cervical DDD and hx of ACDF of C5 and C6 and fibromyalgia who calls into the clinic to discuss medication for this. She has seen Dr. Darene Lamer as well for this and he increased her cymbalta that I started. She could not tolerate the 60mg  dose due to extreme fatigue. I decided to try trintellix. She did not tolerate that due to GI side effects. She went back on lower dose cymbalta and feels like it does help her pain and does not make her quite as sleepy. She continues to have daily bothersome symptoms with her neck and low back with pain and weakness. Her work for 12 hours and repetetive motion makes it even harder. She would like to try 2 weeks off and see if she can get used to Cymbalta and fatigue as well as rest for her neck and back.   She continues on gabapentin with no problems.   .. Active Ambulatory Problems    Diagnosis Date Noted  . GERD (gastroesophageal reflux disease) 03/22/2018  . Hypothyroidism 03/22/2018  . Fibromyalgia 03/22/2018  . Depression, major, single episode, complete remission ([place]) 03/22/2018  . Class 1 obesity due to excess calories without serious comorbidity with body mass index (BMI) of 32.0 to 32.9 in adult 03/22/2018  . No energy 03/25/2018  .  Thyroid nodule 08/03/2018  . Vitamin D insufficiency 08/06/2018  . History of C5-C6 cervical ACDF 01/08/2019  . Lumbar degenerative disc disease 01/17/2019  . SOB (shortness of breath) 07/02/2019  . Cough 07/02/2019  . Abnormal computed tomography angiography (CTA) 07/02/2019   Resolved Ambulatory Problems    Diagnosis Date Noted  . Acute pain of left shoulder 01/08/2019  . History of fusion of cervical spine 01/08/2019  . Neck pain 01/09/2019   Past Medical History:  Diagnosis Date  . DDD (degenerative disc disease), lumbar   . Depression     Reviewed med, allergy, problem list.     Observations/Objective: No acute distress.  Normal mood.  No labored breathing.   .. Today's Vitals   08/05/19 1105  Weight: 195 lb (88.5 kg)  Height: 5\' 3"  (1.6 m)   Body mass index is 34.54 kg/m.    Assessment and Plan: Marland KitchenMarland KitchenDiagnoses and all orders for this visit:  Lumbar degenerative disc disease -     DULoxetine (CYMBALTA) 30 MG capsule; Take 1 capsule (30 mg total) by mouth daily.  Fibromyalgia -     DULoxetine (CYMBALTA) 30 MG capsule; Take 1 capsule (30 mg total) by mouth daily.  History of C5-C6 cervical ACDF -     DULoxetine (CYMBALTA) 30 MG capsule; Take 1 capsule (30 mg total) by mouth daily.   I agree to write patient out of work for 2 weeks to allow her adequate time to adjust  to medication and see if rest improves pain at all. I do think a less physical job could help her daily pain. We could potentially try 30mg  bid of cymbalta in the future if she tolerate better it does help with pain. I suggest follow up with Dr. Karie Schwalbe to consider injections to see if could help with some of her bulging disc. She has tried PT in the past and not helped a lot discussed home stretches for neck and back. Encouraged tens unit and massages.    Follow Up Instructions:    I discussed the assessment and treatment plan with the patient. The patient was provided an opportunity to ask questions  and all were answered. The patient agreed with the plan and demonstrated an understanding of the instructions.   The patient was advised to call back or seek an in-person evaluation if the symptoms worsen or if the condition fails to improve as anticipated.  I provided 13 minutes of non-face-to-face time during this encounter.   Tandy GawJade Luan Maberry, PA-C

## 2019-08-05 NOTE — Progress Notes (Signed)
Patient states her medication makes her sleepy so she doesn't want to take it during the week because of work She then is having a lot of pain She would like to stay out of work for a couple weeks to adjust to medication

## 2019-08-06 ENCOUNTER — Encounter: Payer: Self-pay | Admitting: Physician Assistant

## 2019-08-06 ENCOUNTER — Telehealth: Payer: Self-pay | Admitting: Neurology

## 2019-08-06 NOTE — Telephone Encounter (Signed)
Thanks

## 2019-08-06 NOTE — Telephone Encounter (Signed)
Patient called in and HR wants note for patient to stay out of work and will send STD forms. Letter written and patient will print off mychart.  [place].

## 2019-08-09 ENCOUNTER — Encounter: Payer: Self-pay | Admitting: Physician Assistant

## 2019-08-16 ENCOUNTER — Ambulatory Visit: Payer: 59 | Admitting: Sports Medicine

## 2019-08-18 ENCOUNTER — Other Ambulatory Visit: Payer: Self-pay | Admitting: Physician Assistant

## 2019-08-18 DIAGNOSIS — E039 Hypothyroidism, unspecified: Secondary | ICD-10-CM

## 2019-08-19 ENCOUNTER — Encounter: Payer: Self-pay | Admitting: Sports Medicine

## 2019-08-19 ENCOUNTER — Ambulatory Visit (INDEPENDENT_AMBULATORY_CARE_PROVIDER_SITE_OTHER): Payer: 59 | Admitting: Sports Medicine

## 2019-08-19 ENCOUNTER — Other Ambulatory Visit: Payer: Self-pay

## 2019-08-19 DIAGNOSIS — M5136 Other intervertebral disc degeneration, lumbar region: Secondary | ICD-10-CM | POA: Diagnosis not present

## 2019-08-19 DIAGNOSIS — Z981 Arthrodesis status: Secondary | ICD-10-CM

## 2019-08-19 DIAGNOSIS — M51369 Other intervertebral disc degeneration, lumbar region without mention of lumbar back pain or lower extremity pain: Secondary | ICD-10-CM

## 2019-08-19 MED ORDER — LEVOTHYROXINE SODIUM 100 MCG PO TABS: 100.0000 ug | ORAL_TABLET | Freq: Every day | ORAL | 0 refills | Status: DC

## 2019-08-19 NOTE — Assessment & Plan Note (Signed)
The patient is complaining of left L5 versus S1 distribution radiculitis, both her L5 and S1 nerve roots are fully decompressed both in the spinal canal and in the foraminal locations as well as extraforaminal locations. She does have some mild widespread bilateral facet arthritis. For this reason I do not think she would ever be a surgical candidate however we are going to go ahead and try an epidural first, left L5-S1 interlaminar with Dr. Francesco Runner. If insufficient improvement we will proceed with facet joint injections on the left. If she still gets no relief then we can certainly try nerve conduction/EMG to determine if the nerve is pinched somewhere further down.

## 2019-08-19 NOTE — Addendum Note (Signed)
Addended byAnnamaria Helling on: 08/19/2019 10:48 AM   Modules accepted: Orders

## 2019-08-19 NOTE — Progress Notes (Addendum)
Subjective:    CC: Follow-up  HPI: Melinda Browning is a pleasant 53 year old female, I have not seen her in some time now, we had initially discussed her cervical and lumbar DDD with left-sided radiculitis, left L5 versus S1 in the lumbar spine and left C7 in the cervical spine.  She is post C5-C6 ACDF.  We treated her medically at first with an up taper of gabapentin, and increase in Cymbalta, and therapy.  Unfortunately she did not improve, she is here for further evaluation and definitive treatment  I reviewed the past medical history, family history, social history, surgical history, and allergies today and no changes were needed.  Please see the problem list section below in epic for further details.  Past Medical History: Past Medical History:  Diagnosis Date  . DDD (degenerative disc disease), lumbar   . Depression   . Fibromyalgia   . GERD (gastroesophageal reflux disease)   . Hypothyroidism    Past Surgical History: No past surgical history on file. Social History: Social History   Socioeconomic History  . Marital status: Widowed    Spouse name: Not on file  . Number of children: Not on file  . Years of education: Not on file  . Highest education level: Not on file  Occupational History  . Not on file  Social Needs  . Financial resource strain: Not on file  . Food insecurity    Worry: Not on file    Inability: Not on file  . Transportation needs    Medical: Not on file    Non-medical: Not on file  Tobacco Use  . Smoking status: Never Smoker  . Smokeless tobacco: Never Used  Substance and Sexual Activity  . Alcohol use: Never    Frequency: Never  . Drug use: Never  . Sexual activity: Not Currently  Lifestyle  . Physical activity    Days per week: Not on file    Minutes per session: Not on file  . Stress: Not on file  Relationships  . Social Herbalist on phone: Not on file    Gets together: Not on file    Attends religious service: Not on file   Active member of club or organization: Not on file    Attends meetings of clubs or organizations: Not on file    Relationship status: Not on file  Other Topics Concern  . Not on file  Social History Narrative  . Not on file   Family History: Family History  Problem Relation Age of Onset  . Hypertension Mother   . Hypertension Sister   . Hypertension Brother    Allergies: Allergies  Allergen Reactions  . Gluten Meal Swelling    ?Angioedema  . Omnicef [Cefdinir] Swelling    Angioedema  . Pineapple Swelling  . Lyrica [Pregabalin]     Swelling.   [name] Trintellix [Vortioxetine]     Diarrhea/nausea  . Naproxen Rash    Face rash   Medications: See med rec.  Review of Systems: No fevers, chills, night sweats, weight loss, chest pain, or shortness of breath.   Objective:    General: Well Developed, well nourished, and in no acute distress.  Neuro: Alert and oriented x3, extra-ocular muscles intact, sensation grossly intact.  HEENT: Normocephalic, atraumatic, pupils equal round reactive to light, neck supple, no masses, no lymphadenopathy, thyroid nonpalpable.  Skin: Warm and dry, no rashes. Cardiac: Regular rate and rhythm, no murmurs rubs or gallops, no lower extremity edema.  Respiratory:  Clear to auscultation bilaterally. Not using accessory muscles, speaking in full sentences.  I personally reviewed her cervical and lumbar spine MRIs, she really does not have any areas of lower lumbar neuroforaminal stenosis, and her cervical spine she does have clear left C4-C5 neuroforaminal stenosis.  Impression and Recommendations:    Lumbar degenerative disc disease The patient is complaining of left L5 versus S1 distribution radiculitis, both her L5 and S1 nerve roots are fully decompressed both in the spinal canal and in the foraminal locations as well as extraforaminal locations. She does have some mild widespread bilateral facet arthritis. For this reason I do not think she would  ever be a surgical candidate however we are going to go ahead and try an epidural first, left L5-S1 interlaminar with Dr. Laurian Brim'Toole. If insufficient improvement we will proceed with facet joint injections on the left. If she still gets no relief then we can certainly try nerve conduction/EMG to determine if the nerve is pinched somewhere further down.  History of C5-C6 cervical ACDF Though she does not have any clear neural stenosis in her lumbar spine, she does have clear left-sided C4-C5 neuroforaminal stenosis. Her symptoms are more in a C7 distribution indicating that her presentation is more consistent with fibromyalgia. She has been well treated with Cymbalta, as well as a relatively high dose gabapentin. Because of inefficacy we are going to proceed with a left-sided C6-C7 interlaminar epidural.  Short-term disability paperwork filled out today, sent to scan and then returned to patient.  I spent 25 minutes with this patient, greater than 50% was face-to-face time counseling regarding the above diagnoses.  ___________________________________________ Ihor Austinhomas J. Benjamin Stainhekkekandam, M.D., ABFM., CAQSM. Primary Care and Sports Medicine [facility]  Adjunct Professor of Family Medicine  [employer]

## 2019-08-19 NOTE — Assessment & Plan Note (Addendum)
Though she does not have any clear neural stenosis in her lumbar spine, she does have clear left-sided C4-C5 neuroforaminal stenosis. Her symptoms are more in a C7 distribution indicating that her presentation is more consistent with fibromyalgia. She has been well treated with Cymbalta, as well as a relatively high dose gabapentin. Because of inefficacy we are going to proceed with a left-sided C6-C7 interlaminar epidural.  Short-term disability paperwork filled out today, sent to scan and then returned to patient.

## 2019-08-20 ENCOUNTER — Encounter: Payer: Self-pay | Admitting: Sports Medicine

## 2019-08-20 NOTE — Telephone Encounter (Signed)
Melinda Browning, it sounds like she dropped off some short-term disability paperwork for her neck/shoulder?  I am happy to fill it out since I am managing those conditions.  Let me know if you have them.

## 2019-08-20 NOTE — Telephone Encounter (Signed)
I placed in your box. Thanks!

## 2019-08-21 ENCOUNTER — Other Ambulatory Visit: Payer: Self-pay | Admitting: Physician Assistant

## 2019-08-21 DIAGNOSIS — E039 Hypothyroidism, unspecified: Secondary | ICD-10-CM

## 2019-08-26 ENCOUNTER — Ambulatory Visit (INDEPENDENT_AMBULATORY_CARE_PROVIDER_SITE_OTHER): Payer: 59 | Admitting: Physician Assistant

## 2019-08-26 ENCOUNTER — Other Ambulatory Visit: Payer: Self-pay

## 2019-08-26 VITALS — BP 146/85 | HR 80 | Ht 63.0 in | Wt 196.0 lb

## 2019-08-26 DIAGNOSIS — E538 Deficiency of other specified B group vitamins: Secondary | ICD-10-CM | POA: Diagnosis not present

## 2019-08-26 DIAGNOSIS — E559 Vitamin D deficiency, unspecified: Secondary | ICD-10-CM

## 2019-08-26 DIAGNOSIS — E039 Hypothyroidism, unspecified: Secondary | ICD-10-CM

## 2019-08-26 DIAGNOSIS — M797 Fibromyalgia: Secondary | ICD-10-CM

## 2019-08-26 DIAGNOSIS — M5136 Other intervertebral disc degeneration, lumbar region: Secondary | ICD-10-CM | POA: Diagnosis not present

## 2019-08-26 DIAGNOSIS — Z79899 Other long term (current) drug therapy: Secondary | ICD-10-CM

## 2019-08-26 DIAGNOSIS — Z981 Arthrodesis status: Secondary | ICD-10-CM

## 2019-08-26 DIAGNOSIS — M2550 Pain in unspecified joint: Secondary | ICD-10-CM

## 2019-08-26 MED ORDER — CYANOCOBALAMIN 1000 MCG/ML IJ SOLN
1000.0000 ug | Freq: Once | INTRAMUSCULAR | Status: AC
  Administered 2019-08-26: 1000 ug via INTRAMUSCULAR

## 2019-08-26 MED ORDER — CYANOCOBALAMIN 1000 MCG/ML IJ SOLN: 1000.0000 ug | Freq: Once | INTRAMUSCULAR | 0 refills | Status: DC

## 2019-08-26 MED ORDER — VITAMIN D (ERGOCALCIFEROL) 1.25 MG (50000 UNIT) PO CAPS: 50000.0000 [IU] | ORAL_CAPSULE | ORAL | 1 refills | Status: DC

## 2019-08-26 NOTE — Progress Notes (Signed)
Subjective:    Patient ID: Melinda Browning, female    DOB: January 09, 1966, 53 y.o.   MRN: [id number]  HPI  Patient is a 53 year old female with history of cervical ADCF and lumbar DDD who presents to the clinic for follow-up.  She continues to have a lot of daily chronic pain.  She has seen Dr. Darene Lamer and he recommended injections.  She has a scheduled for Thursday.  He did fill out her paperwork for light duty but sent her back to work.  She is been pretty much continual pain, soreness, muscle tightness.  She cannot tolerate any more than 30 mg of Cymbalta without having side effects.  She is on gabapentin daily she is taking Celebrex daily.  Physical therapy was not very cost effective and did not seem to help.  .. Active Ambulatory Problems    Diagnosis Date Noted  . GERD (gastroesophageal reflux disease) 03/22/2018  . Hypothyroidism 03/22/2018  . Fibromyalgia 03/22/2018  . Depression, major, single episode, complete remission ([place]) 03/22/2018  . Class 1 obesity due to excess calories without serious comorbidity with body mass index (BMI) of 32.0 to 32.9 in adult 03/22/2018  . No energy 03/25/2018  . Thyroid nodule 08/03/2018  . Vitamin D insufficiency 08/06/2018  . History of C5-C6 cervical ACDF 01/08/2019  . Lumbar degenerative disc disease 01/17/2019  . SOB (shortness of breath) 07/02/2019  . Cough 07/02/2019  . Abnormal computed tomography angiography (CTA) 07/02/2019   Resolved Ambulatory Problems    Diagnosis Date Noted  . Acute pain of left shoulder 01/08/2019  . History of fusion of cervical spine 01/08/2019  . Neck pain 01/09/2019   Past Medical History:  Diagnosis Date  . DDD (degenerative disc disease), lumbar   . Depression     Review of Systems  All other systems reviewed and are negative.      Objective:   Physical Exam Vitals signs reviewed.  Constitutional:      Appearance: Normal appearance.  Cardiovascular:     Rate and Rhythm: Normal rate and regular rhythm.      Pulses: Normal pulses.     Heart sounds: Normal heart sounds.  Pulmonary:     Effort: Pulmonary effort is normal.     Breath sounds: Normal breath sounds.  Neurological:     General: No focal deficit present.     Mental Status: She is alert and oriented to person, place, and time.  Psychiatric:        Mood and Affect: Mood normal.        Behavior: Behavior normal.           Assessment & Plan:  Marland KitchenMarland KitchenSapir was seen today for follow-up.  Diagnoses and all orders for this visit:  Hypothyroidism, unspecified type -     TSH  Low serum vitamin B12 -     B12 and Folate Panel -     Discontinue: cyanocobalamin (,VITAMIN B-12,) 1000 MCG/ML injection; Inject 1 mL (1,000 mcg total) into the muscle once for 1 dose. -     cyanocobalamin ((VITAMIN B-12)) injection 1,000 mcg  Vitamin D deficiency -     Vitamin D, Ergocalciferol, (DRISDOL) 1.25 MG (50000 UT) CAPS capsule; Take 1 capsule (50,000 Units total) by mouth every 7 (seven) days. -     VITAMIN D 25 Hydroxy (Vit-D Deficiency, Fractures)  Medication management -     TSH -     B12 and Folate Panel -     VITAMIN D 25 Hydroxy (Vit-D  Deficiency, Fractures) -     COMPLETE METABOLIC PANEL WITH GFR  Pain in joint involving multiple sites -     Antinuclear Antib (ANA)  Fibromyalgia  History of C5-C6 cervical ACDF  Lumbar degenerative disc disease   Patient is in need of some labs for medication management.  I do want to make sure that her vitamin D and B12 levels are maximized.  I did go ahead and send over weekly vitamin D and we gave her a B12 shot here in the office today.  We will see if she has any improvement in energy or how she feels with B12 shot.  Her serum B12 levels were not deficient but on the low side.  Patient was encouraged on getting lumbar injections and hopefully she will see a lot of improvement with this.  I certainly think a lot of her pain could be myofascial.  Unfortunately she cannot tolerate any more  than 30 mg of Cymbalta.  She is already taking gabapentin.  She is on Celebrex twice a day.  Strongly encouraged a low anti-inflammatory diet.

## 2019-08-26 NOTE — Patient Instructions (Signed)
Myofascial Pain Syndrome and Fibromyalgia Myofascial pain syndrome and fibromyalgia are both pain disorders. This pain may be felt mainly in your muscles.  Myofascial pain syndrome: ? Always has tender points in the muscle that will cause pain when pressed (trigger points). The pain may come and go. ? Usually affects your neck, upper back, and shoulder areas. The pain often radiates into your arms and hands.  Fibromyalgia: ? Has muscle pains and tenderness that come and go. ? Is often associated with fatigue and sleep problems. ? Has trigger points. ? Tends to be long-lasting (chronic), but is not life-threatening. Fibromyalgia and myofascial pain syndrome are not the same. However, they often occur together. If you have both conditions, each can make the other worse. Both are common and can cause enough pain and fatigue to make day-to-day activities difficult. Both can be hard to diagnose because their symptoms are common in many other conditions. What are the causes? The exact causes of these conditions are not known. What increases the risk? You are more likely to develop this condition if:  You have a family history of the condition.  You have certain triggers, such as: ? Spine disorders. ? An injury (trauma) or other physical stressors. ? Being under a lot of stress. ? Medical conditions such as osteoarthritis, rheumatoid arthritis, or lupus. What are the signs or symptoms? Fibromyalgia The main symptom of fibromyalgia is widespread pain and tenderness in your muscles. Pain is sometimes described as stabbing, shooting, or burning. You may also have:  Tingling or numbness.  Sleep problems and fatigue.  Problems with attention and concentration (fibro fog). Other symptoms may include:  Bowel and bladder problems.  Headaches.  Visual problems.  Problems with odors and noises.  Depression or mood changes.  Painful menstrual periods (dysmenorrhea).  Dry skin or  eyes. These symptoms can vary over time. Myofascial pain syndrome Symptoms of myofascial pain syndrome include:  Tight, ropy bands of muscle.  Uncomfortable sensations in muscle areas. These may include aching, cramping, burning, numbness, tingling, and weakness.  Difficulty moving certain parts of the body freely (poor range of motion). How is this diagnosed? This condition may be diagnosed by your symptoms and medical history. You will also have a physical exam. In general:  Fibromyalgia is diagnosed if you have pain, fatigue, and other symptoms for more than 3 months, and symptoms cannot be explained by another condition.  Myofascial pain syndrome is diagnosed if you have trigger points in your muscles, and those trigger points are tender and cause pain elsewhere in your body (referred pain). How is this treated? Treatment for these conditions depends on the type that you have.  For fibromyalgia: ? Pain medicines, such as NSAIDs. ? Medicines for treating depression. ? Medicines for treating seizures. ? Medicines that relax the muscles.  For myofascial pain: ? Pain medicines, such as NSAIDs. ? Cooling and stretching of muscles. ? Trigger point injections. ? Sound wave (ultrasound) treatments to stimulate muscles. Treating these conditions often requires a team of health care providers. These may include:  Your primary care provider.  Physical therapist.  Complementary health care providers, such as massage therapists or acupuncturists.  Psychiatrist for cognitive behavioral therapy. Follow these instructions at home: Medicines  Take over-the-counter and prescription medicines only as told by your health care provider.  Do not drive or use heavy machinery while taking prescription pain medicine.  If you are taking prescription pain medicine, take actions to prevent or treat constipation. Your health care   provider may recommend that you: ? Drink enough fluid to keep  your urine pale yellow. ? Eat foods that are high in fiber, such as fresh fruits and vegetables, whole grains, and beans. ? Limit foods that are high in fat and processed sugars, such as fried or sweet foods. ? Take an over-the-counter or prescription medicine for constipation. Lifestyle   Exercise as directed by your health care provider or physical therapist.  Practice relaxation techniques to control your stress. You may want to try: ? Biofeedback. ? Visual imagery. ? Hypnosis. ? Muscle relaxation. ? Yoga. ? Meditation.  Maintain a healthy lifestyle. This includes eating a healthy diet and getting enough sleep.  Do not use any products that contain nicotine or tobacco, such as cigarettes and e-cigarettes. If you need help quitting, ask your health care provider. General instructions  Talk to your health care provider about complementary treatments, such as acupuncture or massage.  Consider joining a support group with others who are diagnosed with this condition.  Do not do activities that stress or strain your muscles. This includes repetitive motions and heavy lifting.  Keep all follow-up visits as told by your health care provider. This is important. Where to find more information  National Fibromyalgia Association: www.fmaware.org  Arthritis Foundation: www.arthritis.org  American Chronic Pain Association: www.theacpa.org Contact a health care provider if:  You have new symptoms.  Your symptoms get worse or your pain is severe.  You have side effects from your medicines.  You have trouble sleeping.  Your condition is causing depression or anxiety. Summary  Myofascial pain syndrome and fibromyalgia are pain disorders.  Myofascial pain syndrome has tender points in the muscle that will cause pain when pressed (trigger points). Fibromyalgia also has muscle pains and tenderness that come and go, but this condition is often associated with fatigue and sleep  disturbances.  Fibromyalgia and myofascial pain syndrome are not the same but often occur together, causing pain and fatigue that make day-to-day activities difficult.  Treatment for fibromyalgia includes taking medicines to relax the muscles and medicines for pain, depression, or seizures. Treatment for myofascial pain syndrome includes taking medicines for pain, cooling and stretching of muscles, and injecting medicines into trigger points.  Follow your health care provider's instructions for taking medicines and maintaining a healthy lifestyle. This information is not intended to replace advice given to you by your health care provider. Make sure you discuss any questions you have with your health care provider. Document Released: 11/21/2005 Document Revised: 03/15/2019 Document Reviewed: 12/06/2017 Elsevier Patient Education  2020 [employer]  

## 2019-08-27 ENCOUNTER — Encounter: Payer: Self-pay | Admitting: Physician Assistant

## 2019-08-27 DIAGNOSIS — E538 Deficiency of other specified B group vitamins: Secondary | ICD-10-CM | POA: Insufficient documentation

## 2019-08-27 DIAGNOSIS — M2550 Pain in unspecified joint: Secondary | ICD-10-CM | POA: Insufficient documentation

## 2019-09-02 ENCOUNTER — Encounter: Payer: Self-pay | Admitting: Sports Medicine

## 2019-09-02 DIAGNOSIS — Z981 Arthrodesis status: Secondary | ICD-10-CM

## 2019-09-10 ENCOUNTER — Telehealth: Payer: Self-pay

## 2019-09-10 NOTE — Telephone Encounter (Signed)
Patient called, states she accidentally ate some gluten on Sunday and has been having SX of swelling, hives on face and all over body, and extreme itchiness. Patient called to see what she should do.  Patient was advised to be evaluated at ER for allergic reaction. Patient was advised to have someone else drive her,  She states her daughter will take her. Agreeable with plan to be seen at ER, no further needs at this time.   FYI to PCP

## 2019-09-20 ENCOUNTER — Encounter: Payer: Self-pay | Admitting: Physician Assistant

## 2019-09-20 NOTE — Telephone Encounter (Signed)
Called Pt. Offered visit today. She requested Monday, Pt scheduled. Advised if she experiences SOB over the weekend to seek emergent care. Verbalized understanding.

## 2019-09-23 ENCOUNTER — Encounter: Payer: Self-pay | Admitting: Physician Assistant

## 2019-09-23 ENCOUNTER — Ambulatory Visit (INDEPENDENT_AMBULATORY_CARE_PROVIDER_SITE_OTHER): Payer: Managed Care, Other (non HMO) | Admitting: Physician Assistant

## 2019-09-23 ENCOUNTER — Other Ambulatory Visit: Payer: Self-pay

## 2019-09-23 VITALS — BP 153/75 | HR 96 | Ht 63.5 in | Wt 200.0 lb

## 2019-09-23 DIAGNOSIS — R0602 Shortness of breath: Secondary | ICD-10-CM

## 2019-09-23 DIAGNOSIS — E538 Deficiency of other specified B group vitamins: Secondary | ICD-10-CM

## 2019-09-23 DIAGNOSIS — L509 Urticaria, unspecified: Secondary | ICD-10-CM | POA: Diagnosis not present

## 2019-09-23 DIAGNOSIS — N938 Other specified abnormal uterine and vaginal bleeding: Secondary | ICD-10-CM

## 2019-09-23 DIAGNOSIS — E559 Vitamin D deficiency, unspecified: Secondary | ICD-10-CM | POA: Diagnosis not present

## 2019-09-23 DIAGNOSIS — R030 Elevated blood-pressure reading, without diagnosis of hypertension: Secondary | ICD-10-CM

## 2019-09-23 DIAGNOSIS — F419 Anxiety disorder, unspecified: Secondary | ICD-10-CM

## 2019-09-23 MED ORDER — EPINEPHRINE 0.3 MG/0.3ML IJ SOAJ: 0.3000 mg | INTRAMUSCULAR | 1 refills | Status: DC | PRN

## 2019-09-23 MED ORDER — LORAZEPAM 0.5 MG PO TABS: 0.5000 mg | ORAL_TABLET | Freq: Two times a day (BID) | ORAL | 0 refills | Status: DC | PRN

## 2019-09-23 MED ORDER — MEDROXYPROGESTERONE ACETATE 10 MG PO TABS: ORAL_TABLET | ORAL | 0 refills | Status: DC

## 2019-09-23 NOTE — Progress Notes (Signed)
Subjective:    Patient ID: Melinda Browning, female    DOB: 02-10-1966, 53 y.o.   MRN: [id number]  HPI  Pt is a 53 yo female with hypothyroidism, GERD, gluten intolerance allergy who presents to the clinic to follow up after reaction.   About 3 weeks ago she was eating with son and ate a sandwich. She usually orders herself and stays gluten free but she decided not too. She does not have diagnoses of gluten allergy but she felt better when she did not eat gluten. After eating sandwich she started feeling bad. Her abdomen started to swell, she started to feel tingling all over, and started to have hives pop out. She denies any SOB or lip swelling but she felt different swallowing. She went to UC and given shot of benadryl and steroid and oral prednisone. She is feeling better. Her hives have not completely gone away yet. She still feels 'winded'. Albuterol used sometimes but does not help. No leg swelling or pain. No recent travel, surgery or injury. Pt has some ongoing anxiety and she does feel anxious about this happening. She is almost scared to eat.   Only new medication was mobic 3 weeks ago.   She also mentions bleeding for the last 2 weeks. She is still having periods but not usually this long. She is "worn out".   .. Active Ambulatory Problems    Diagnosis Date Noted  . GERD (gastroesophageal reflux disease) 03/22/2018  . Hypothyroidism 03/22/2018  . Fibromyalgia 03/22/2018  . Depression, major, single episode, complete remission ([facility]) 03/22/2018  . Class 1 obesity due to excess calories without serious comorbidity with body mass index (BMI) of 32.0 to 32.9 in adult 03/22/2018  . No energy 03/25/2018  . Thyroid nodule 08/03/2018  . Vitamin D deficiency 08/06/2018  . History of C5-C6 cervical ACDF 01/08/2019  . Lumbar degenerative disc disease 01/17/2019  . SOB (shortness of breath) 07/02/2019  . Cough 07/02/2019  . Abnormal computed tomography angiography (CTA) 07/02/2019  . Low serum  vitamin B12 08/27/2019  . Pain in joint involving multiple sites 08/27/2019  . Elevated blood pressure reading 09/27/2019  . B12 deficiency 09/27/2019  . Hives 09/27/2019   Resolved Ambulatory Problems    Diagnosis Date Noted  . Acute pain of left shoulder 01/08/2019  . History of fusion of cervical spine 01/08/2019  . Neck pain 01/09/2019   Past Medical History:  Diagnosis Date  . DDD (degenerative disc disease), lumbar   . Depression       Review of Systems See HPI.    Objective:   Physical Exam Vitals signs reviewed.  Constitutional:      Appearance: Normal appearance.  HENT:     Head: Normocephalic.     Right Ear: Tympanic membrane normal.     Left Ear: Tympanic membrane normal.     Nose: Congestion present.     Mouth/Throat:     Mouth: Mucous membranes are moist.     Pharynx: No oropharyngeal exudate or posterior oropharyngeal erythema.  Eyes:     Extraocular Movements: Extraocular movements intact.     Conjunctiva/sclera: Conjunctivae normal.     Pupils: Pupils are equal, round, and reactive to light.  Cardiovascular:     Rate and Rhythm: Normal rate and regular rhythm.  Pulmonary:     Effort: Pulmonary effort is normal.     Breath sounds: Normal breath sounds. No wheezing or rhonchi.  Skin:    Comments: Residual slightly raised red patches over  arms, legs, and trunk.   Neurological:     General: No focal deficit present.     Mental Status: She is alert and oriented to person, place, and time.  Psychiatric:        Mood and Affect: Mood normal.        Behavior: Behavior normal.           Assessment & Plan:  Marland KitchenMarland KitchenElina was seen today for urticaria.  Diagnoses and all orders for this visit:  Hives -     CBC with Differential/Platelet -     B12 and Folate Panel -     VITAMIN D 25 Hydroxy (Vit-D Deficiency, Fractures) -     COMPLETE METABOLIC PANEL WITH GFR -     ANA -     TSH  SOB (shortness of breath) -     CBC with Differential/Platelet -      B12 and Folate Panel -     VITAMIN D 25 Hydroxy (Vit-D Deficiency, Fractures) -     COMPLETE METABOLIC PANEL WITH GFR -     ANA -     TSH  B12 deficiency -     B12 and Folate Panel  Vitamin D deficiency -     VITAMIN D 25 Hydroxy (Vit-D Deficiency, Fractures)  Elevated blood pressure reading  Anxiety -     LORazepam (ATIVAN) 0.5 MG tablet; Take 1 tablet (0.5 mg total) by mouth 2 (two) times daily as needed for anxiety.  DUB (dysfunctional uterine bleeding) -     medroxyPROGESTERone (PROVERA) 10 MG tablet; Take one tablet twice a day for 10 days.  Other orders -     Discontinue: EPINEPHrine 0.3 mg/0.3 mL IJ SOAJ injection; Inject 0.3 mLs (0.3 mg total) into the muscle as needed for anaphylaxis.  Finish prednisone.   Will make referral to allergy to tease out hives and gluten sensivity vs celiac vs allergy.  Sent epipen to use if experiencing SOB/lip swelling/excessive hives. Keep benadryl on hand to use as needed. Consider zyrtec daily.   Will get some labs. SOB I think could be more due to anxiety about health right now. Ativan given to take as needed. Discussed risk of overuse and dependency. No concerns for DVT/PE, lung exam was great no wheezing. Follow up if worsening or changing.   For DUB. Start provera for next 10 days. Will get u/s.   BP elevated today. Likely due to prednisone.   [name].Spent 30 minutes with patient and greater than 50 percent of visit spent counseling patient regarding treatment plan.

## 2019-09-23 NOTE — Patient Instructions (Signed)
Will make allergy referal.

## 2019-09-24 ENCOUNTER — Encounter: Payer: Self-pay | Admitting: Physician Assistant

## 2019-09-24 NOTE — Progress Notes (Signed)
WBC is falsely elevated due to prednisone.  B12 looks great.  Thyroid is great.  Vitamin D is still low. How much vitamin D are you taking?

## 2019-09-24 NOTE — Telephone Encounter (Signed)
Tried to call [employer], no answer, phone just rings. Will try again later.

## 2019-09-24 NOTE — Telephone Encounter (Signed)
Can we call pharmacy and see what would be cheaper option for epi pen?

## 2019-09-25 LAB — CBC WITH DIFFERENTIAL/PLATELET
Absolute Monocytes: 1410 cells/uL — ABNORMAL HIGH (ref 200–950)
Basophils Absolute: 75 cells/uL (ref 0–200)
Basophils Relative: 0.4 %
Eosinophils Absolute: 56 cells/uL (ref 15–500)
Eosinophils Relative: 0.3 %
HCT: 40.9 % (ref 35.0–45.0)
Hemoglobin: 13.6 g/dL (ref 11.7–15.5)
Lymphs Abs: 5546 cells/uL — ABNORMAL HIGH (ref 850–3900)
MCH: 29.8 pg (ref 27.0–33.0)
MCHC: 33.3 g/dL (ref 32.0–36.0)
MCV: 89.7 fL (ref 80.0–100.0)
MPV: 10.4 fL (ref 7.5–12.5)
Monocytes Relative: 7.5 %
Neutro Abs: 11712 cells/uL — ABNORMAL HIGH (ref 1500–7800)
Neutrophils Relative %: 62.3 %
Platelets: 338 10*3/uL (ref 140–400)
RBC: 4.56 10*6/uL (ref 3.80–5.10)
RDW: 13.4 % (ref 11.0–15.0)
Total Lymphocyte: 29.5 %
WBC: 18.8 10*3/uL — ABNORMAL HIGH (ref 3.8–10.8)

## 2019-09-25 LAB — COMPLETE METABOLIC PANEL WITH GFR
AG Ratio: 1.5 (calc) (ref 1.0–2.5)
ALT: 25 U/L (ref 6–29)
AST: 18 U/L (ref 10–35)
Albumin: 3.8 g/dL (ref 3.6–5.1)
Alkaline phosphatase (APISO): 88 U/L (ref 37–153)
BUN: 9 mg/dL (ref 7–25)
CO2: 23 mmol/L (ref 20–32)
Calcium: 9.2 mg/dL (ref 8.6–10.4)
Chloride: 108 mmol/L (ref 98–110)
Creat: 0.73 mg/dL (ref 0.50–1.05)
GFR, Est African American: 110 mL/min/{1.73_m2} (ref >=60)
GFR, Est Non African American: 95 mL/min/{1.73_m2} (ref >=60)
Globulin: 2.5 g/dL (calc) (ref 1.9–3.7)
Glucose, Bld: 84 mg/dL (ref 65–99)
Potassium: 3.6 mmol/L (ref 3.5–5.3)
Sodium: 140 mmol/L (ref 135–146)
Total Bilirubin: 0.3 mg/dL (ref 0.2–1.2)
Total Protein: 6.3 g/dL (ref 6.1–8.1)

## 2019-09-25 LAB — VITAMIN D 25 HYDROXY (VIT D DEFICIENCY, FRACTURES): Vit D, 25-Hydroxy: 24 ng/mL — ABNORMAL LOW (ref 30–100)

## 2019-09-25 LAB — B12 AND FOLATE PANEL
Folate: 9.5 ng/mL
Vitamin B-12: 561 pg/mL (ref 200–1100)

## 2019-09-25 LAB — ANA: Anti Nuclear Antibody (ANA): NEGATIVE

## 2019-09-25 LAB — TSH: TSH: 0.45 mIU/L

## 2019-09-25 MED ORDER — EPINEPHRINE 0.3 MG/0.3ML IJ SOAJ: 0.3000 mg | INTRAMUSCULAR | 1 refills | Status: DC | PRN

## 2019-09-25 NOTE — Progress Notes (Signed)
ANA is negative! Great news.

## 2019-09-27 ENCOUNTER — Encounter: Payer: Self-pay | Admitting: Physician Assistant

## 2019-09-27 DIAGNOSIS — E538 Deficiency of other specified B group vitamins: Secondary | ICD-10-CM | POA: Insufficient documentation

## 2019-09-27 DIAGNOSIS — L509 Urticaria, unspecified: Secondary | ICD-10-CM | POA: Insufficient documentation

## 2019-09-27 DIAGNOSIS — R030 Elevated blood-pressure reading, without diagnosis of hypertension: Secondary | ICD-10-CM | POA: Insufficient documentation

## 2019-09-27 DIAGNOSIS — L508 Other urticaria: Secondary | ICD-10-CM | POA: Insufficient documentation

## 2019-09-30 ENCOUNTER — Other Ambulatory Visit: Payer: Managed Care, Other (non HMO)

## 2019-10-07 ENCOUNTER — Other Ambulatory Visit: Payer: Self-pay | Admitting: Sports Medicine

## 2019-10-07 ENCOUNTER — Encounter: Payer: Self-pay | Admitting: Sports Medicine

## 2019-10-07 DIAGNOSIS — Z981 Arthrodesis status: Secondary | ICD-10-CM

## 2019-10-10 ENCOUNTER — Encounter: Payer: Self-pay | Admitting: Physician Assistant

## 2019-10-21 ENCOUNTER — Other Ambulatory Visit: Payer: Self-pay

## 2019-10-21 ENCOUNTER — Encounter: Payer: Self-pay | Admitting: Pediatrics

## 2019-10-21 ENCOUNTER — Ambulatory Visit (INDEPENDENT_AMBULATORY_CARE_PROVIDER_SITE_OTHER): Payer: Managed Care, Other (non HMO) | Admitting: Pediatrics

## 2019-10-21 VITALS — BP 124/84 | HR 86 | Temp 98.4°F | Resp 20 | Ht 64.0 in | Wt 194.7 lb

## 2019-10-21 DIAGNOSIS — K219 Gastro-esophageal reflux disease without esophagitis: Secondary | ICD-10-CM

## 2019-10-21 DIAGNOSIS — J4530 Mild persistent asthma, uncomplicated: Secondary | ICD-10-CM | POA: Diagnosis not present

## 2019-10-21 DIAGNOSIS — L503 Dermatographic urticaria: Secondary | ICD-10-CM

## 2019-10-21 DIAGNOSIS — J3089 Other allergic rhinitis: Secondary | ICD-10-CM | POA: Diagnosis not present

## 2019-10-21 DIAGNOSIS — E538 Deficiency of other specified B group vitamins: Secondary | ICD-10-CM

## 2019-10-21 DIAGNOSIS — E559 Vitamin D deficiency, unspecified: Secondary | ICD-10-CM

## 2019-10-21 DIAGNOSIS — T7800XA Anaphylactic reaction due to unspecified food, initial encounter: Secondary | ICD-10-CM | POA: Diagnosis not present

## 2019-10-21 MED ORDER — FAMOTIDINE 20 MG PO TABS: ORAL_TABLET | ORAL | 5 refills | Status: DC

## 2019-10-21 MED ORDER — MONTELUKAST SODIUM 10 MG PO TABS: ORAL_TABLET | ORAL | 5 refills | Status: DC

## 2019-10-21 MED ORDER — ALBUTEROL SULFATE HFA 108 (90 BASE) MCG/ACT IN AERS: 2.0000 | INHALATION_SPRAY | RESPIRATORY_TRACT | 1 refills | Status: DC | PRN

## 2019-10-21 NOTE — Progress Notes (Signed)
[address] [place] [address] Dept: [phone]  New Patient Note  Patient ID: Melinda Browning, female    DOB: February 28, 1966  Age: 53 y.o. MRN: [id number] Date of Office Visit: 10/21/2019 Referring provider: Jomarie Longs, PA-C [address] [place],  [place] [address]    Chief Complaint: Urticaria (after pt. eats gluten which has caused throat swelling on 3 different occasions and hives), Bronchitis (diagnosed with asthma as a child, but here lately she has been having trouble with her breathing), and Food Intolerance (pineapple causes a rash, cherries stomach pain, blueberries diarrhea)  HPI Melinda Browning presents for for an allergy evaluation.  For the past 4 to 5 years she has had 3 episodes of hives and shortness of breath after eating food with gluten.  Pineapples give her a rash, blueberries give her diarrhea and cherries give her abdominal pain.  At times she has heartburn.  She has not had a history of eczema or chronic urticaria.  She has had hives from naproxen but can tolerates Celebrex and ibuprofen.  Celebrex does give her peripheral edema if she takes it for many days at a time.  She developed asthma as a child.  During the past 3 months she has had more difficulties from asthma..  She has had pneumonia twice in the past.  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  She has had seasonal allergic rhinitis for several years.  She has aggravation of her symptoms on exposure to dust, cigarette smoke, cats and the springtime.  Review of Systems  Constitutional: Negative.   HENT:       Seasonal allergic rhinitis for several years  Eyes: Negative.   Respiratory:       Asthma since childhood.  During the past 3 months more  coughing , wheezing and shortness of breath.  Pneumonia twice in the past.  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   Musculoskeletal:       Fibromyalgia.  Degenerative joint disease  Skin:       Hives 3 times from gluten.  Also hives from Naprosyn  Neurological:       Cervical fusion of vertebrae.  Herniated disc repair in the lower back  Endo/Heme/Allergies:       No diabetes.  Has low thyroid function.  Vitamin B12 and vitamin D deficiency  Psychiatric/Behavioral: Negative.     Outpatient Encounter Medications as of 10/21/2019  Medication Sig  . celecoxib (CELEBREX) 200 MG capsule Take 1 capsule (200 mg total) by mouth 2 (two) times daily.  . Cyanocobalamin (VITAMIN B-12 IJ) Inject as directed. Once monthly  . cyclobenzaprine (FLEXERIL) 10 MG tablet Take 0.5-1 tablets (5-10 mg total) by mouth 3 (three) times daily as needed for muscle spasms. Caution: can cause drowsiness  . DULoxetine (CYMBALTA) 30 MG capsule Take 1 capsule (30 mg total) by mouth daily.  [name] EPINEPHrine 0.3 mg/0.3 mL IJ SOAJ injection Inject 0.3 mLs (0.3 mg total) into the muscle as needed for anaphylaxis.  [name] gabapentin (NEURONTIN) 600 MG tablet TAKE 1 TABLET BY MOUTH THREE TIMES DAILY  . ibuprofen (ADVIL) 800 MG tablet Take 1 tablet (  800 mg total) by mouth every 8 (eight) hours as needed.  [name]. levothyroxine (EUTHYROX) 100 MCG tablet Take 1 tablet (100 mcg total) by mouth daily before breakfast. NEEDS LABS  . LORazepam (ATIVAN) 0.5 MG tablet Take 1 tablet (0.5 mg total) by mouth 2 (two) times daily as needed for anxiety.  . medroxyPROGESTERone (PROVERA) 10 MG tablet Take one tablet twice a day for 10 days.  . Vitamin D, Ergocalciferol, (DRISDOL) 1.25 MG (50000 UT) CAPS capsule Take 1 capsule (50,000 Units total) by mouth every 7 (seven) days.  [name]. albuterol (VENTOLIN HFA) 108 (90 Base) MCG/ACT inhaler Inhale 2 puffs into the lungs every 4  (four) hours as needed for wheezing or shortness of breath.  . famotidine (PEPCID) 20 MG tablet Take 1 tablet twice a day for heartburn. If your symptoms are better take famotidine 1 tablet at night.  . montelukast (SINGULAIR) 10 MG tablet Take 1 tablet once a day for coughing or wheezing.  . predniSONE (DELTASONE) 20 MG tablet   . triamcinolone cream (KENALOG) 0.5 % Apply 1 application topically 2 (two) times daily. To affected areas for itching (Patient not taking: Reported on 10/21/2019)   No facility-administered encounter medications on file as of 10/21/2019.      Drug Allergies:  Allergies  Allergen Reactions  . Gluten Meal Swelling    ?Angioedema  . Omnicef [Cefdinir] Swelling    Angioedema  . Pineapple Swelling  . Lyrica [Pregabalin]     Swelling.   [name]. Trintellix [Vortioxetine]     Diarrhea/nausea  . Naproxen Rash    Face rash    Family History: [name]'s family history includes Allergic rhinitis in her daughter and son; Asthma in her brother and daughter; Eczema in her daughter; Hypertension in her brother, mother, and sister..  Family history is negative for sinus problems, angioedema, urticaria, food allergies, lupus, chronic bronchitis or emphysema.  Social and environmental.  There is a dog in the home.  She is not exposed to cigarette smoking.  She has not smoked cigarettes in the past.  She works in a [employer], [employer] making boxes  Physical Exam: BP 124/84 (BP Location: Right Arm, Patient Position: Sitting, Cuff Size: Normal)   Pulse 86   Temp 98.4 F (36.9 C) (Oral)   Resp 20   Ht 5\' 4"  (1.626 m)   Wt 194 lb 10.7 oz (88.3 kg)   SpO2 99%   BMI 33.41 kg/m    Physical Exam Vitals signs reviewed.  Constitutional:      Appearance: Normal appearance. She is obese.  HENT:     Head:     Comments: Eyes normal.  Ears normal.  Nose normal.  Pharynx normal. Neck:     Musculoskeletal: Neck supple.     Comments: No thyromegaly Cardiovascular:     Comments: S1-S2 normal no  murmurs Pulmonary:     Comments: Clear to percussion and auscultation Abdominal:     Palpations: Abdomen is soft.     Comments: Mild nonspecific tenderness in the midepigastrium.  No hepatosplenomegaly  Lymphadenopathy:     Cervical: No cervical adenopathy.  Skin:    Comments: Clear but she had dermographia noted  Neurological:     General: No focal deficit present.     Mental Status: She is alert and oriented to person, place, and time.  Psychiatric:        Mood and Affect: Mood normal.        Behavior: Behavior normal.  Thought Content: Thought content normal.        Judgment: Judgment normal.     Diagnostics: FVC 2.40 L FEV1 2.16 L.  Predicted FVC 3.42 L predicted FEV1 2.76 L.  After albuterol 2 puffs FVC 2.65 L FEV1 2.34 L-this shows a mild reduction in the forced vital capacity.  There is no significant improvement after albuterol  Allergy skin test were positive to some molds ,cat  and cockroach on intradermal testing only.  Skin testing to foods showed mild reactivity to wheat   Assessment  Assessment and Plan: 1. Mild persistent asthma without complication   2. Allergy with anaphylaxis due to food   3. Other allergic rhinitis   4. Dermographia   5. Gastroesophageal reflux disease without esophagitis   6. Vitamin B12 deficiency   7. Vitamin D deficiency     Meds ordered this encounter  Medications  . montelukast (SINGULAIR) 10 MG tablet    Sig: Take 1 tablet once a day for coughing or wheezing.    Dispense:  34 tablet    Refill:  5  . albuterol (VENTOLIN HFA) 108 (90 Base) MCG/ACT inhaler    Sig: Inhale 2 puffs into the lungs every 4 (four) hours as needed for wheezing or shortness of breath.    Dispense:  18 g    Refill:  1    Please keep rx on file. Pt. Will call when needed.  . famotidine (PEPCID) 20 MG tablet    Sig: Take 1 tablet twice a day for heartburn. If your symptoms are better take famotidine 1 tablet at night.    Dispense:  64 tablet     Refill:  5    Patient Instructions  Environmental control of dust and mold Zyrtec 10 mg take 1 tablet once a day for runny nose or itchy eyes Fluticasone 2 sprays per nostril once  a day if needed for stuffy nose  Montelukast 10 mg-take 1 tablet once a day to prevent coughing or wheezing Ventolin 2 puffs every 4 hours if needed for wheezing or coughing spells.  You may use Ventolin 2 puffs 5 to 15 minutes before exercise  Avoid gluten.  If you have an allergic reaction take Benadryl 50 mg every 4 hours and if you have life-threatening symptoms inject was EpiPen 0.3 mg Then write what you had to eat or drink in the previous 4 hours Avoid pineapple, cherries and blueberries which will irritate your stomach Do foods with salicylates make you itch?  Famotidine 20 mg-take 1 tablet twice a day for heartburn.  If your symptoms are better take ranitidine 1 tablet at night  Continue on your other medications Call us if you are not doing well on this treatment plan   Return in about 4 weeks (around 11/18/2019).   Thank you for the opportunity to care for this patient.  Please do not hesitate to contact me with questions.  Penne Lash, M.D.  [facility] [address] [place], [place] [address] [phone]

## 2019-10-21 NOTE — Patient Instructions (Addendum)
Environmental control of dust and mold Zyrtec 10 mg take 1 tablet once a day for runny nose or itchy eyes Fluticasone 2 sprays per nostril once  a day if needed for stuffy nose  Montelukast 10 mg-take 1 tablet once a day to prevent coughing or wheezing Ventolin 2 puffs every 4 hours if needed for wheezing or coughing spells.  You may use Ventolin 2 puffs 5 to 15 minutes before exercise  Avoid gluten.  If you have an allergic reaction take Benadryl 50 mg every 4 hours and if you have life-threatening symptoms inject was EpiPen 0.3 mg Then write what you had to eat or drink in the previous 4 hours Avoid pineapple, cherries and blueberries which will irritate your stomach Do foods with salicylates make you itch?  Famotidine 20 mg-take 1 tablet twice a day for heartburn.  If your symptoms are better take ranitidine 1 tablet at night  Continue on your other medications Call us if you are not doing well on this treatment plan

## 2019-10-28 ENCOUNTER — Encounter: Payer: Self-pay | Admitting: Neurology

## 2019-10-28 ENCOUNTER — Ambulatory Visit (INDEPENDENT_AMBULATORY_CARE_PROVIDER_SITE_OTHER): Payer: 59 | Admitting: Physician Assistant

## 2019-10-28 DIAGNOSIS — E039 Hypothyroidism, unspecified: Secondary | ICD-10-CM

## 2019-10-28 DIAGNOSIS — Z5329 Procedure and treatment not carried out because of patient's decision for other reasons: Secondary | ICD-10-CM

## 2019-10-28 NOTE — Progress Notes (Signed)
No show

## 2019-12-03 ENCOUNTER — Telehealth: Payer: Self-pay | Admitting: Physician Assistant

## 2019-12-03 ENCOUNTER — Other Ambulatory Visit: Payer: Self-pay | Admitting: *Deleted

## 2019-12-03 DIAGNOSIS — Z1231 Encounter for screening mammogram for malignant neoplasm of breast: Secondary | ICD-10-CM

## 2019-12-03 DIAGNOSIS — E039 Hypothyroidism, unspecified: Secondary | ICD-10-CM

## 2019-12-03 DIAGNOSIS — E041 Nontoxic single thyroid nodule: Secondary | ICD-10-CM

## 2019-12-03 MED ORDER — LEVOTHYROXINE SODIUM 100 MCG PO TABS: 100.0000 ug | ORAL_TABLET | Freq: Every day | ORAL | 0 refills | Status: DC

## 2019-12-03 NOTE — Telephone Encounter (Signed)
Left message on machine for patient to call back to let us know if okay to order imaging.

## 2019-12-03 NOTE — Telephone Encounter (Signed)
Due for repeat mammogram and thyroid u/s follow up. Ok to schedule?

## 2019-12-03 NOTE — Telephone Encounter (Signed)
-----   Message from Lizvette Lightsey L Ryliee Figge, PA-C sent at 08/03/2018  4:16 PM EDT ----- Follow up thyroid nodule  

## 2019-12-03 NOTE — Telephone Encounter (Signed)
-----   Message from Akeel Reffner L Joycelyn Liska, PA-C sent at 08/03/2018  4:16 PM EDT ----- Follow up thyroid nodule  

## 2019-12-04 NOTE — Telephone Encounter (Signed)
Called patient again. No answer.  Left message on machine for patient to call back.  Placed orders for thyroid US and mgm so they would contact patient as well.

## 2019-12-09 ENCOUNTER — Other Ambulatory Visit: Payer: Self-pay | Admitting: Physician Assistant

## 2019-12-09 DIAGNOSIS — E538 Deficiency of other specified B group vitamins: Secondary | ICD-10-CM

## 2019-12-31 ENCOUNTER — Encounter: Payer: Self-pay | Admitting: Physician Assistant

## 2020-01-03 MED ORDER — AMITRIPTYLINE HCL 25 MG PO TABS: 25.0000 mg | ORAL_TABLET | Freq: Every day | ORAL | 0 refills | Status: DC

## 2020-02-13 ENCOUNTER — Encounter: Payer: Self-pay | Admitting: Physician Assistant

## 2020-03-17 ENCOUNTER — Other Ambulatory Visit: Payer: Self-pay | Admitting: Sports Medicine

## 2020-03-17 ENCOUNTER — Other Ambulatory Visit: Payer: Self-pay | Admitting: Physician Assistant

## 2020-03-17 DIAGNOSIS — E039 Hypothyroidism, unspecified: Secondary | ICD-10-CM

## 2020-03-17 DIAGNOSIS — Z981 Arthrodesis status: Secondary | ICD-10-CM

## 2020-03-17 DIAGNOSIS — E538 Deficiency of other specified B group vitamins: Secondary | ICD-10-CM

## 2020-03-30 ENCOUNTER — Telehealth: Payer: Self-pay | Admitting: Physician Assistant

## 2020-03-30 NOTE — Telephone Encounter (Signed)
If patient willing make sure follow up u/s of thyroid nodule is ordered.

## 2020-03-30 NOTE — Telephone Encounter (Signed)
I did try to contact in December about this and ordered.   Tried to call again today with no answer. LMOM letting her know this has been ordered and gave her the contact number for imaging.

## 2020-03-30 NOTE — Telephone Encounter (Signed)
-----   Message from Jomarie Longs, [place] sent at 08/03/2018  4:16 PM EDT ----- Follow up thyroid nodule

## 2020-04-01 ENCOUNTER — Ambulatory Visit (INDEPENDENT_AMBULATORY_CARE_PROVIDER_SITE_OTHER): Payer: BC Managed Care – PPO

## 2020-04-01 ENCOUNTER — Other Ambulatory Visit: Payer: Self-pay

## 2020-04-01 DIAGNOSIS — Z1231 Encounter for screening mammogram for malignant neoplasm of breast: Secondary | ICD-10-CM

## 2020-04-01 DIAGNOSIS — E041 Nontoxic single thyroid nodule: Secondary | ICD-10-CM | POA: Diagnosis not present

## 2020-04-14 ENCOUNTER — Other Ambulatory Visit: Payer: Self-pay | Admitting: Neurology

## 2020-04-14 DIAGNOSIS — E039 Hypothyroidism, unspecified: Secondary | ICD-10-CM

## 2020-04-14 LAB — TSH: TSH: 0.8 mIU/L

## 2020-04-14 MED ORDER — LEVOTHYROXINE SODIUM 100 MCG PO TABS: ORAL_TABLET | ORAL | 3 refills | Status: DC

## 2020-04-14 NOTE — Progress Notes (Signed)
Subjective:    CC: Generalized body pain  HPI: 54 year old female presenting today to discuss diffuse body pain.  Diagnosed with fibromyalgia and has been treated with Cymbalta and gabapentin.  Reports her body pain has been worsening over the last 6 months and that Cymbalta and gabapentin are not effective.  Patient reports the medications were not effective since before the last appointment that she had to follow-up in October 2020.  Reports trying Lyrica previously but had swelling so this was added to her allergy list.  Tried Cymbalta 60 mg, this caused too much sedation.  Endorses eating a gluten-free diet.  Admits that she is not currently exercising but reports that she does do some stretching.  Currently working 10-hour shifts at her job requiring her to lift and be on her feet most of the day.  Has paperwork that she would like filled out to allow her accommodations for work but has been unable to print them.  Has had some skin lesions over her buttocks and thighs develop.  Reports the lesions started as a pimple that they do not heal.  Admits to scratching and picking at the "pimple" although she said it does not itch on the outside.  Denies fever, chills, myalgias, chemical exposures, changes in cosmetics/materials/detergents.  I reviewed the past medical history, family history, social history, surgical history, and allergies today and no changes were needed.  Please see the problem list section below in epic for further details.  Past Medical History: Past Medical History:  Diagnosis Date  . DDD (degenerative disc disease), lumbar   . Depression   . Fibromyalgia   . GERD (gastroesophageal reflux disease)   . Hypothyroidism    Past Surgical History: Past Surgical History:  Procedure Laterality Date  . CERVICAL FUSION  2009  . CESAREAN SECTION  [phone]  . LUMBAR LAMINECTOMY  2014   Social History: Social History   Socioeconomic History  . Marital status: Widowed     Spouse name: Not on file  . Number of children: Not on file  . Years of education: Not on file  . Highest education level: Not on file  Occupational History  . Not on file  Tobacco Use  . Smoking status: Never Smoker  . Smokeless tobacco: Never Used  Substance and Sexual Activity  . Alcohol use: Never  . Drug use: Never  . Sexual activity: Not Currently  Other Topics Concern  . Not on file  Social History Narrative  . Not on file   Social Determinants of Health   Financial Resource Strain:   . Difficulty of Paying Living Expenses:   Food Insecurity:   . Worried About Programme researcher, broadcasting/film/video in the Last Year:   . Barista in the Last Year:   Transportation Needs:   . Freight forwarder (Medical):   [name] Lack of Transportation (Non-Medical):   Physical Activity:   . Days of Exercise per Week:   . Minutes of Exercise per Session:   Stress:   . Feeling of Stress :   Social Connections:   . Frequency of Communication with Friends and Family:   . Frequency of Social Gatherings with Friends and Family:   . Attends Religious Services:   . Active Member of Clubs or Organizations:   . Attends Banker Meetings:   [name] Marital Status:    Family History: Family History  Problem Relation Age of Onset  . Hypertension Mother   . Hypertension Sister   .  Hypertension Brother   . Asthma Brother   . Allergic rhinitis Son   . Asthma Daughter   . Allergic rhinitis Daughter   . Eczema Daughter   . Angioedema Neg Hx   . Immunodeficiency Neg Hx   . Urticaria Neg Hx    Allergies: Allergies  Allergen Reactions  . Gluten Meal Swelling    ?Angioedema  . Omnicef [Cefdinir] Swelling    Angioedema  . Pineapple Swelling  . Lyrica [Pregabalin]     Swelling.   [name] Trintellix [Vortioxetine]     Diarrhea/nausea  . Naproxen Rash    Face rash   Medications: See med rec.  Review of Systems: See HPI for pertinent positives and negatives.   Objective:    General: Well  Developed, well nourished, and in no acute distress.  Neuro: Alert and oriented x3.  HEENT: Normocephalic, atraumatic.  Skin: Warm and dry.  Several areas of brown discoloration noted scattered over the buttocks and left thigh.  One scabbed 76mm lesion to the left thigh without erythema or drainage. No nodules, pustules, or areas of fluctuance noted. Cardiac: Regular rate and rhythm, no murmurs rubs or gallops, no lower extremity edema.  Respiratory: Clear to auscultation bilaterally. Not using accessory muscles, speaking in full sentences.   Impression and Recommendations:    1. Fibromyalgia Increase Duloxetine to 40mg  daily. Increase Gabapentin to 800mg  three times daily.  May use Flexeril as needed to help sleep at night.  Discussed general pain management measures including heat, ice, and massage.  Discussed avoiding inflammatory foods and incorporating daily exercise.  Recommend low impact activities such as yoga, swimming, and gentle stretching.  Also discussed chronic nature of fibromyalgia and long-term expectations for management.  May continue to use Tylenol as needed.  Unable to tolerate anti-inflammatories.  Advised patient to ask at the front desk about sending paperwork to the office for closer evaluation.  May benefit from reducing hours of work from 10 hrs/day to 8hrs/day but will need to see more about the company requirements before making that determination.  2. Skin lesions Unclear etiology.  Consider acne versus folliculitis.  Areas of discoloration from old lesions appear to be normal healing of the skin.  Advised patient to avoid scratching and picking at these lesions as this will make the discoloration worse and increase the risk of infection.  If another lesion develops, advised patient to come in for in person evaluation.  Return in about 4 weeks (around 05/13/2020) for fibromyalgia follow up. ___________________________________________ Clearnce Sorrel, DNP, APRN,  FNP-BC [facility]

## 2020-04-15 ENCOUNTER — Encounter: Payer: Self-pay | Admitting: Medical-Surgical

## 2020-04-15 ENCOUNTER — Ambulatory Visit (INDEPENDENT_AMBULATORY_CARE_PROVIDER_SITE_OTHER): Payer: BC Managed Care – PPO | Admitting: Medical-Surgical

## 2020-04-15 ENCOUNTER — Other Ambulatory Visit: Payer: Self-pay

## 2020-04-15 VITALS — BP 156/90 | HR 90 | Temp 98.2°F | Ht 64.0 in | Wt 193.7 lb

## 2020-04-15 DIAGNOSIS — L989 Disorder of the skin and subcutaneous tissue, unspecified: Secondary | ICD-10-CM | POA: Diagnosis not present

## 2020-04-15 DIAGNOSIS — Z114 Encounter for screening for human immunodeficiency virus [HIV]: Secondary | ICD-10-CM

## 2020-04-15 DIAGNOSIS — M797 Fibromyalgia: Secondary | ICD-10-CM | POA: Diagnosis not present

## 2020-04-15 MED ORDER — GABAPENTIN 800 MG PO TABS: 800.0000 mg | ORAL_TABLET | Freq: Three times a day (TID) | ORAL | 1 refills | Status: DC

## 2020-04-15 MED ORDER — CYCLOBENZAPRINE HCL 10 MG PO TABS: 5.0000 mg | ORAL_TABLET | Freq: Three times a day (TID) | ORAL | 0 refills | Status: DC | PRN

## 2020-04-15 MED ORDER — DULOXETINE HCL 40 MG PO CPEP: 40.0000 mg | ORAL_CAPSULE | Freq: Every day | ORAL | 0 refills | Status: DC

## 2020-04-27 ENCOUNTER — Encounter: Payer: Self-pay | Admitting: Physician Assistant

## 2020-04-28 ENCOUNTER — Encounter: Payer: Self-pay | Admitting: Family Medicine

## 2020-04-28 ENCOUNTER — Ambulatory Visit (INDEPENDENT_AMBULATORY_CARE_PROVIDER_SITE_OTHER): Payer: BC Managed Care – PPO | Admitting: Family Medicine

## 2020-04-28 DIAGNOSIS — R21 Rash and other nonspecific skin eruption: Secondary | ICD-10-CM

## 2020-04-28 MED ORDER — HYDROXYZINE PAMOATE 25 MG PO CAPS
25.0000 mg | ORAL_CAPSULE | Freq: Three times a day (TID) | ORAL | 0 refills | Status: DC | PRN
Start: 2020-04-28 — End: 2021-02-19

## 2020-04-28 MED ORDER — CLOBETASOL PROPIONATE 0.05 % EX CREA: 1.0000 "application " | TOPICAL_CREAM | Freq: Two times a day (BID) | CUTANEOUS | 0 refills | Status: DC

## 2020-04-28 NOTE — Assessment & Plan Note (Signed)
  Adding vistaril to help with associated itching. .  Add clobetasol cream Will avoid oral steroids due to recent COVID vaccine.

## 2020-04-28 NOTE — Progress Notes (Signed)
Melinda Browning - 54 y.o. female MRN [id number]  Date of birth: 04/02/1966  Subjective Chief Complaint  Patient presents with  . Urticaria    HPI Melinda Browning is a 54 y.o. female here today with complaint of rash.  Reports that rash is located on arms, legs and abdomen.  It started a few weeks ago.  Denies changes to medication.  She has not changed any soaps, lotions or detergents.  She feels like this did worsen some after her 2nd COVID vaccine.  She denies any other symptoms including fever, chills, or respiratory symptoms. She has tried triamcinolone without much improvement.  She has had some improvement with antihistamine.    ROS:  A comprehensive ROS was completed and negative except as noted per HPI  Allergies  Allergen Reactions  . Gluten Meal Swelling    ?Angioedema  . Omnicef [Cefdinir] Swelling    Angioedema  . Pineapple Swelling  . Lyrica [Pregabalin]     Swelling.   [name] Trintellix [Vortioxetine]     Diarrhea/nausea  . Naproxen Rash    Face rash    Past Medical History:  Diagnosis Date  . DDD (degenerative disc disease), lumbar   . Depression   . Fibromyalgia   . GERD (gastroesophageal reflux disease)   . Hypothyroidism     Past Surgical History:  Procedure Laterality Date  . CERVICAL FUSION  2009  . CESAREAN SECTION  [phone]  . LUMBAR LAMINECTOMY  2014    Social History   Socioeconomic History  . Marital status: Widowed    Spouse name: Not on file  . Number of children: Not on file  . Years of education: Not on file  . Highest education level: Not on file  Occupational History  . Not on file  Tobacco Use  . Smoking status: Never Smoker  . Smokeless tobacco: Never Used  Substance and Sexual Activity  . Alcohol use: Never  . Drug use: Never  . Sexual activity: Not Currently  Other Topics Concern  . Not on file  Social History Narrative  . Not on file   Social Determinants of Health   Financial Resource Strain:   . Difficulty of Paying  Living Expenses:   Food Insecurity:   . Worried About Programme researcher, broadcasting/film/video in the Last Year:   . Barista in the Last Year:   Transportation Needs:   . Freight forwarder (Medical):   [name] Lack of Transportation (Non-Medical):   Physical Activity:   . Days of Exercise per Week:   . Minutes of Exercise per Session:   Stress:   . Feeling of Stress :   Social Connections:   . Frequency of Communication with Friends and Family:   . Frequency of Social Gatherings with Friends and Family:   . Attends Religious Services:   . Active Member of Clubs or Organizations:   . Attends Banker Meetings:   [name] Marital Status:     Family History  Problem Relation Age of Onset  . Hypertension Mother   . Hypertension Sister   . Hypertension Brother   . Asthma Brother   . Allergic rhinitis Son   . Asthma Daughter   . Allergic rhinitis Daughter   . Eczema Daughter   . Angioedema Neg Hx   . Immunodeficiency Neg Hx   . Urticaria Neg Hx     Health Maintenance  Topic Date Due  . HIV Screening  Never done  . PAP SMEAR-Modifier  Never done  . COVID-19 Vaccine (2 - Pfizer 2-dose series) 04/22/2020  . COLONOSCOPY  08/25/2020 (Originally 03/20/2017)  . INFLUENZA VACCINE  07/05/2020  . MAMMOGRAM  04/01/2022  . TETANUS/TDAP  09/15/2028     ----------------------------------------------------------------------------------------------------------------------------------------------------------------------------------------------------------------- Physical Exam BP 125/84 (BP Location: Left Arm, Patient Position: Sitting, Cuff Size: Normal)   Pulse 91   Ht 5' 4.17" (1.63 m)   Wt 197 lb 8 oz (89.6 kg)   BMI 33.72 kg/m   Physical Exam Constitutional:      Appearance: Normal appearance.  Eyes:     General: No scleral icterus. Cardiovascular:     Rate and Rhythm: Normal rate and regular rhythm.  Skin:    Comments: Scattered papules on arms, trunk and legs.  Some bruising on  legs.  No swelling or drainage from areas. (reports from dropping something on them at work.)  Neurological:     General: No focal deficit present.     Mental Status: She is alert.  Psychiatric:        Mood and Affect: Mood normal.        Behavior: Behavior normal.     ------------------------------------------------------------------------------------------------------------------------------------------------------------------------------------------------------------------- Assessment and Plan  Rash  Adding vistaril to help with associated itching. .  Add clobetasol cream Will avoid oral steroids due to recent COVID vaccine.      Meds ordered this encounter  Medications  . clobetasol cream (TEMOVATE) 0.05 %    Sig: Apply 1 application topically 2 (two) times daily.    Dispense:  60 g    Refill:  0  . hydrOXYzine (VISTARIL) 25 MG capsule    Sig: Take 1 capsule (25 mg total) by mouth 3 (three) times daily as needed for itching.    Dispense:  30 capsule    Refill:  0    No follow-ups on file.    This visit occurred during the SARS-CoV-2 public health emergency.  Safety protocols were in place, including screening questions prior to the visit, additional usage of staff PPE, and extensive cleaning of exam room while observing appropriate contact time as indicated for disinfecting solutions.

## 2020-04-28 NOTE — Patient Instructions (Signed)
Try clobetasol cream to areas.  Try hydroxyzine as needed for itching.  Let us know if not improving.

## 2020-05-13 ENCOUNTER — Encounter: Payer: Self-pay | Admitting: Medical-Surgical

## 2020-05-13 ENCOUNTER — Ambulatory Visit (INDEPENDENT_AMBULATORY_CARE_PROVIDER_SITE_OTHER): Payer: BC Managed Care – PPO | Admitting: Medical-Surgical

## 2020-05-13 ENCOUNTER — Other Ambulatory Visit: Payer: Self-pay

## 2020-05-13 VITALS — BP 139/93 | HR 87 | Temp 98.1°F | Ht 64.0 in | Wt 197.2 lb

## 2020-05-13 DIAGNOSIS — E538 Deficiency of other specified B group vitamins: Secondary | ICD-10-CM | POA: Diagnosis not present

## 2020-05-13 DIAGNOSIS — M797 Fibromyalgia: Secondary | ICD-10-CM

## 2020-05-13 DIAGNOSIS — K219 Gastro-esophageal reflux disease without esophagitis: Secondary | ICD-10-CM

## 2020-05-13 LAB — VITAMIN B12: Vitamin B-12: 516 pg/mL (ref 200–1100)

## 2020-05-13 MED ORDER — FAMOTIDINE 20 MG PO TABS: ORAL_TABLET | ORAL | 5 refills | Status: DC

## 2020-05-13 NOTE — Progress Notes (Signed)
Subjective:    CC: Fibromyalgia follow-up  HPI: Pleasant 54 year old female presenting today for follow-up on fibromyalgia.  At last visit with me we adjusted her doses of Cymbalta and gabapentin.  Since then she has been taking Cymbalta 40 mg daily and gabapentin 800 mg 3 times daily.  She has tolerated both these medication doses well without side effects.  Reports her overall pain levels have decreased since starting the higher dose.  She does still have some myofascial pain and reports that she works at [employer] causing her to have significant fatigue in the evenings.  Unfortunately her workplace does not have air conditioning.  Reports that her GERD symptoms have worsened over the last few weeks, especially at night.  Taking famotidine once daily without relief.  Presented with a vitamin B12 injection bottle and asking for [place] to give her the injection today.  Last vitamin B12 levels checked in October 2020 and prescription that she has on hand was filled in September of last year.   I reviewed the past medical history, family history, social history, surgical history, and allergies today and no changes were needed.  Please see the problem list section below in epic for further details.  Past Medical History: Past Medical History:  Diagnosis Date  . DDD (degenerative disc disease), lumbar   . Depression   . Fibromyalgia   . GERD (gastroesophageal reflux disease)   . Hypothyroidism    Past Surgical History: Past Surgical History:  Procedure Laterality Date  . CERVICAL FUSION  2009  . CESAREAN SECTION  [phone]  . LUMBAR LAMINECTOMY  2014   Social History: Social History   Socioeconomic History  . Marital status: Widowed    Spouse name: Not on file  . Number of children: Not on file  . Years of education: Not on file  . Highest education level: Not on file  Occupational History  . Not on file  Tobacco Use  . Smoking status: Never Smoker  . Smokeless tobacco: Never  Used  Substance and Sexual Activity  . Alcohol use: Never  . Drug use: Never  . Sexual activity: Not Currently  Other Topics Concern  . Not on file  Social History Narrative  . Not on file   Social Determinants of Health   Financial Resource Strain:   . Difficulty of Paying Living Expenses:   Food Insecurity:   . Worried About Programme researcher, broadcasting/film/video in the Last Year:   . Barista in the Last Year:   Transportation Needs:   . Freight forwarder (Medical):   [name] Lack of Transportation (Non-Medical):   Physical Activity:   . Days of Exercise per Week:   . Minutes of Exercise per Session:   Stress:   . Feeling of Stress :   Social Connections:   . Frequency of Communication with Friends and Family:   . Frequency of Social Gatherings with Friends and Family:   . Attends Religious Services:   . Active Member of Clubs or Organizations:   . Attends Banker Meetings:   [name] Marital Status:    Family History: Family History  Problem Relation Age of Onset  . Hypertension Mother   . Hypertension Sister   . Hypertension Brother   . Asthma Brother   . Allergic rhinitis Son   . Asthma Daughter   . Allergic rhinitis Daughter   . Eczema Daughter   . Angioedema Neg Hx   . Immunodeficiency Neg Hx   .  Urticaria Neg Hx    Allergies: Allergies  Allergen Reactions  . Gluten Meal Swelling    ?Angioedema  . Lyrica [Pregabalin] Swelling  . Omnicef [Cefdinir] Swelling    Angioedema  . Pineapple Swelling  . Trintellix [Vortioxetine] Diarrhea and Nausea Only  . Naproxen Rash    Face rash   Medications: See med rec.  Review of Systems: See HPI for pertinent positives and negatives.   Objective:    General: Well Developed, well nourished, and in no acute distress.  Neuro: Alert and oriented x3, extra-ocular muscles intact, sensation grossly intact.  HEENT: Normocephalic, atraumatic, pupils equal round reactive to light, neck supple, no masses, no lymphadenopathy,  thyroid nonpalpable.  Skin: Warm and dry, no rashes. Cardiac: Regular rate and rhythm, no murmurs rubs or gallops, no lower extremity edema.  Respiratory: Clear to auscultation bilaterally. Not using accessory muscles, speaking in full sentences.   Impression and Recommendations:    1. Fibromyalgia Continue Cymbalta 40 mg daily and gabapentin 800 mg 3 times daily.  Doing well on this regimen and agreeable to continue without further alterations in dosing.  2. Low serum vitamin B12 Discussed recommendations to check vitamin B12 level before providing an injection to make sure we are safely treating her.  Patient verbalized understanding. Checking B12 level today.  If indicated, she can return and we will give her her vitamin B12 shot. - Vitamin B12  3. Gastroesophageal reflux disease without esophagitis Advised patient to begin taking famotidine twice daily to see if this further manages her symptoms.  Refills provided. - famotidine (PEPCID) 20 MG tablet; Take 1 tablet twice a day for heartburn.  Dispense: 60 tablet; Refill: 5  Return if symptoms worsen or fail to improve.  Follow-up with PCP per her recommendations. ___________________________________________ Clearnce Sorrel, DNP, APRN, FNP-BC [facility] [place]

## 2020-05-29 ENCOUNTER — Ambulatory Visit (INDEPENDENT_AMBULATORY_CARE_PROVIDER_SITE_OTHER): Payer: BC Managed Care – PPO | Admitting: Nurse Practitioner

## 2020-05-29 ENCOUNTER — Encounter: Payer: Self-pay | Admitting: Nurse Practitioner

## 2020-05-29 VITALS — BP 137/87 | HR 82 | Temp 97.8°F | Ht 64.0 in | Wt 195.6 lb

## 2020-05-29 DIAGNOSIS — L509 Urticaria, unspecified: Secondary | ICD-10-CM | POA: Diagnosis not present

## 2020-05-29 DIAGNOSIS — R519 Headache, unspecified: Secondary | ICD-10-CM | POA: Diagnosis not present

## 2020-05-29 DIAGNOSIS — M797 Fibromyalgia: Secondary | ICD-10-CM | POA: Diagnosis not present

## 2020-05-29 MED ORDER — GABAPENTIN 800 MG PO TABS: 800.0000 mg | ORAL_TABLET | Freq: Three times a day (TID) | ORAL | 3 refills | Status: DC

## 2020-05-29 MED ORDER — DEXAMETHASONE 1 MG/ML PO CONC
10.0000 mg | Freq: Once | ORAL | Status: AC
  Administered 2020-05-29: 10 mg via ORAL

## 2020-05-29 MED ORDER — CETIRIZINE HCL 10 MG PO TABS: 10.0000 mg | ORAL_TABLET | Freq: Two times a day (BID) | ORAL | 1 refills | Status: DC

## 2020-05-29 MED ORDER — PREDNISONE 50 MG PO TABS: 50.0000 mg | ORAL_TABLET | Freq: Every day | ORAL | 0 refills | Status: DC

## 2020-05-29 MED ORDER — KETOROLAC TROMETHAMINE 60 MG/2ML IM SOLN
60.0000 mg | Freq: Once | INTRAMUSCULAR | Status: AC
  Administered 2020-05-29: 60 mg via INTRAMUSCULAR

## 2020-05-29 NOTE — Progress Notes (Signed)
Acute Office Visit  Subjective:    Patient ID: Melinda Browning, female    DOB: 1965/12/30, 54 y.o.   MRN: [id number]  Chief Complaint  Patient presents with  . Rash    left side of neck, under breasts, thighs, right arm, onset:6d, Benadryl not helping, HA, sneezing    HPI Patient is in today for urticaria, headache, and shortness of breath that has been on going since last Saturday. She reports that she went out to eat at a restaurant with family and starting feel bad after eating. She has a gluten allergy and reports that these symptoms are consistent with an exacerbation of her gluten allergy. She has tried benadryl and prescription steroid cream to the areas of irritation with no change. She reports in the past she has had to be put on steroids for up to a month when this has happened. She has been using her albuterol inhaler for help with some of the shortness of breath. She reports the headache is a 7/10.   She denies dizziness, weakness, numbness, nausea, vomiting, diarrhea, difficulty breathing, or swelling of her tongue or throat.  She endorses scattered urticaria on her neck, chest, arms, and right thigh, headache, and mild shortness of breath.    Past Medical History:  Diagnosis Date  . DDD (degenerative disc disease), lumbar   . Depression   . Fibromyalgia   . GERD (gastroesophageal reflux disease)   . Hypothyroidism     Past Surgical History:  Procedure Laterality Date  . CERVICAL FUSION  2009  . CESAREAN SECTION  [phone]  . LUMBAR LAMINECTOMY  2014    Family History  Problem Relation Age of Onset  . Hypertension Mother   . Hypertension Sister   . Hypertension Brother   . Asthma Brother   . Allergic rhinitis Son   . Asthma Daughter   . Allergic rhinitis Daughter   . Eczema Daughter   . Angioedema Neg Hx   . Immunodeficiency Neg Hx   . Urticaria Neg Hx     Social History   Socioeconomic History  . Marital status: Widowed    Spouse name: Not on file  .  Number of children: Not on file  . Years of education: Not on file  . Highest education level: Not on file  Occupational History  . Not on file  Tobacco Use  . Smoking status: Never Smoker  . Smokeless tobacco: Never Used  Vaping Use  . Vaping Use: Never used  Substance and Sexual Activity  . Alcohol use: Never  . Drug use: Never  . Sexual activity: Not Currently  Other Topics Concern  . Not on file  Social History Narrative  . Not on file   Social Determinants of Health   Financial Resource Strain:   . Difficulty of Paying Living Expenses:   Food Insecurity:   . Worried About Programme researcher, broadcasting/film/video in the Last Year:   . Barista in the Last Year:   Transportation Needs:   . Freight forwarder (Medical):   [name] Lack of Transportation (Non-Medical):   Physical Activity:   . Days of Exercise per Week:   . Minutes of Exercise per Session:   Stress:   . Feeling of Stress :   Social Connections:   . Frequency of Communication with Friends and Family:   . Frequency of Social Gatherings with Friends and Family:   . Attends Religious Services:   . Active Member of Clubs or Organizations:   .  Attends Banker Meetings:   [name] Marital Status:   Intimate Partner Violence:   . Fear of Current or Ex-Partner:   . Emotionally Abused:   [name] Physically Abused:   . Sexually Abused:     Outpatient Medications Prior to Visit  Medication Sig Dispense Refill  . albuterol (VENTOLIN HFA) 108 (90 Base) MCG/ACT inhaler Inhale 2 puffs into the lungs every 4 (four) hours as needed for wheezing or shortness of breath. 18 g 1  . celecoxib (CELEBREX) 200 MG capsule Take 1 capsule (200 mg total) by mouth 2 (two) times daily. 60 capsule 1  . clobetasol cream (TEMOVATE) 0.05 % Apply 1 application topically 2 (two) times daily. 60 g 0  . cyclobenzaprine (FLEXERIL) 10 MG tablet Take 0.5-1 tablets (5-10 mg total) by mouth 3 (three) times daily as needed for muscle spasms. Caution: can  cause drowsiness 30 tablet 0  . DULoxetine 40 MG CPEP Take 40 mg by mouth daily. 90 capsule 0  . EPINEPHrine 0.3 mg/0.3 mL IJ SOAJ injection Inject 0.3 mLs (0.3 mg total) into the muscle as needed for anaphylaxis. 1 each 1  . famotidine (PEPCID) 20 MG tablet Take 1 tablet twice a day for heartburn. 60 tablet 5  . hydrOXYzine (VISTARIL) 25 MG capsule Take 1 capsule (25 mg total) by mouth 3 (three) times daily as needed for itching. 30 capsule 0  . levothyroxine (SYNTHROID) 100 MCG tablet TAKE 1 TABLET BY MOUTH ONCE DAILY BEFORE BREAKFAST 90 tablet 3  . montelukast (SINGULAIR) 10 MG tablet Take 1 tablet once a day for coughing or wheezing. 34 tablet 5  . Vitamin D, Ergocalciferol, (DRISDOL) 1.25 MG (50000 UT) CAPS capsule Take 1 capsule (50,000 Units total) by mouth every 7 (seven) days. 12 capsule 1  . gabapentin (NEURONTIN) 800 MG tablet Take 1 tablet (800 mg total) by mouth 3 (three) times daily. 90 tablet 1   No facility-administered medications prior to visit.    Allergies  Allergen Reactions  . Gluten Meal Swelling    ?Angioedema  . Lyrica [Pregabalin] Swelling  . Omnicef [Cefdinir] Swelling    Angioedema  . Pineapple Swelling  . Trintellix [Vortioxetine] Diarrhea and Nausea Only  . Naproxen Rash    Face rash      Objective:    Physical Exam Constitutional:      Appearance: Normal appearance.  HENT:     Head: Normocephalic.     Comments: No evidence of angioedema or swelling to the face, neck, or throat.     Mouth/Throat:     Mouth: Mucous membranes are moist.     Pharynx: Oropharynx is clear.  Eyes:     Extraocular Movements: Extraocular movements intact.     Conjunctiva/sclera: Conjunctivae normal.     Pupils: Pupils are equal, round, and reactive to light.  Neck:     Vascular: No carotid bruit.  Cardiovascular:     Rate and Rhythm: Normal rate and regular rhythm.     Pulses: Normal pulses.     Heart sounds: Normal heart sounds.  Pulmonary:     Breath sounds:  Normal breath sounds.  Abdominal:     General: Abdomen is flat.     Palpations: Abdomen is soft.  Musculoskeletal:        General: Normal range of motion.     Cervical back: Normal range of motion.  Skin:    General: Skin is warm and dry.     Capillary Refill: Capillary refill takes less than  2 seconds.     Findings: Rash present. Rash is urticarial.     Comments: Scattered erythematous areas of urticaria on the right side her neck, her upper chest, her right and left arms, and her right anterior thigh.   Neurological:     General: No focal deficit present.     Mental Status: She is alert and oriented to person, place, and time.  Psychiatric:        Mood and Affect: Mood normal.        Behavior: Behavior normal.        Thought Content: Thought content normal.        Judgment: Judgment normal.     BP (!) 156/101   Pulse 82   Temp 97.8 F (36.6 C) (Oral)   Ht 5\' 4"  (1.626 m)   Wt 195 lb 9.6 oz (88.7 kg)   SpO2 99%   BMI 33.57 kg/m  Wt Readings from Last 3 Encounters:  05/29/20 195 lb 9.6 oz (88.7 kg)  05/13/20 197 lb 3.2 oz (89.4 kg)  04/28/20 197 lb 8 oz (89.6 kg)    There are no preventive care reminders to display for this patient.  There are no preventive care reminders to display for this patient.   Lab Results  Component Value Date   TSH 0.80 04/13/2020   Lab Results  Component Value Date   WBC 18.8 (H) 09/23/2019   HGB 13.6 09/23/2019   HCT 40.9 09/23/2019   MCV 89.7 09/23/2019   PLT 338 09/23/2019   Lab Results  Component Value Date   NA 140 09/23/2019   K 3.6 09/23/2019   CO2 23 09/23/2019   GLUCOSE 84 09/23/2019   BUN 9 09/23/2019   CREATININE 0.73 09/23/2019   BILITOT 0.3 09/23/2019   AST 18 09/23/2019   ALT 25 09/23/2019   PROT 6.3 09/23/2019   CALCIUM 9.2 09/23/2019   Lab Results  Component Value Date   CHOL 195 08/03/2018   Lab Results  Component Value Date   HDL 50 (L) 08/03/2018   Lab Results  Component Value Date   LDLCALC  121 (H) 08/03/2018   Lab Results  Component Value Date   TRIG 129 08/03/2018   Lab Results  Component Value Date   CHOLHDL 3.9 08/03/2018   No results found for: HGBA1C     Assessment & Plan:  1. Urticaria Urticaria for the past 6 days after possible exposure to gluten while eating at a restaurant. Her symptoms are consistent with past exposure to gluten. We will utilize a dexamethasone injection today and oral prednisone starting tomorrow. She currently has a prescription for hydroxyzine and takes oral pepcid daily. Will start high dose cetirizine twice a day and continue other current medication regimens.   PLAN: Dexamethasone 60mg  injection in office today Oral ceterizine 10mg  twice a day for the next 30 days Continue daily pepcid Continue hydroxyzine at bedtime Continue to utilize your inhaler, if needed Keep epi-pen on you at all times If your symptoms worsen, or you start to feel worsening shortness of breath, swelling in your neck, throat, lips, or face seek emergency care immediately. If your symptoms do not resolve, please schedule a follow-up  - cetirizine (ZYRTEC) 10 MG tablet; Take 1 tablet (10 mg total) by mouth 2 (two) times daily.  Dispense: 60 tablet; Refill: 1 - dexamethasone (DECADRON) 1 MG/ML solution 10 mg - predniSONE (DELTASONE) 50 MG tablet; Take 1 tablet (50 mg total) by mouth daily with breakfast  for 10 days.  Dispense: 10 tablet; Refill: 0  2. Fibromyalgia Refill for gabapentin - gabapentin (NEURONTIN) 800 MG tablet; Take 1 tablet (800 mg total) by mouth 3 (three) times daily.  Dispense: 90 tablet; Refill: 3  3. Headache around the eyes Headache related to suspected gluten consumption six days ago. She reports that she safely takes ibuprofen- naproxen is on her allergy list, although she is unsure if it was naproxen or something else that caused the rash. We will treat with ketorolac 60mg  injection today with dexamethasone.   PLAN: Ketorolac 60mg   injection in office today Dexamethasone 10mg  injection in office today Follow-up if symptoms worsen or fail to improve - ketorolac (TORADOL) injection 60 mg   , NP

## 2020-05-29 NOTE — Patient Instructions (Addendum)
We having given you a steroid shot today with a medication called Dexamethasone.   Tomorrow I want you to start taking the steroid, Prednisone, by mouth. You will take this every morning for 10 days.  I also want you to start taking cetirizine (Zyrtec) 10 mg one tablet every morning and one tablet every evening for the next 30 days.   You can also continue the hydroxyzine at bedtime.   Continue to take famotidine (Pepcid) every day.     Hives Hives are itchy, red, swollen areas on your skin. Hives can show up on any part of your body. Hives often fade within 24 hours (acute hives). New hives can show up after old ones fade. This can go on for many days or weeks (chronic hives). Hives do not spread from person to person (are not contagious). Hives are caused by your body's response to something that you are allergic to (allergen). These are sometimes called triggers. You can get hives right after being around a trigger, or hours later. What are the causes?  Allergies to foods.  Insect bites or stings.  Pollen.  Pets.  Latex.  Chemicals.  Spending time in sunlight, heat, or cold.  Exercise.  Stress.  Some medicines.  Viruses. This includes the common cold.  Infections caused by germs (bacteria).  Allergy shots.  Blood transfusions. Sometimes, the cause is not known. What increases the risk?  Being a woman.  Being allergic to foods such as: ? Citrus fruits. ? Milk. ? Eggs. ? Peanuts. ? Tree nuts. ? Shellfish.  Being allergic to: ? Medicines. ? Latex. ? Insects. ? Animals. ? Pollen. What are the signs or symptoms?   Raised, itchy, red or white bumps or patches on your skin. These areas may: ? Get large and swollen. ? Change in shape and location. ? Stand alone or connect to each other over a large area of skin. ? Sting or hurt. ? Turn white when pressed in the center (blanch). In very bad cases, your hands, feet, and face may also get swollen. This  may happen if hives start deeper in your skin. How is this treated? Treatment for this condition depends on your symptoms. Treatment may include:  Using cool, wet cloths (cool compresses) or taking cool showers to stop the itching.  Medicines that help: ? Relieve itching (antihistamines). ? Reduce swelling (corticosteroids). ? Treat infection (antibiotics).  A medicine (omalizumab) that is given as a shot (injection). Your doctor may prescribe this if you have hives that do not get better even after other treatments.  In very bad cases, you may need a shot of a medicine called epinephrineto prevent a life-threatening allergic reaction (anaphylaxis). Follow these instructions at home: Medicines  Take or apply over-the-counter and prescription medicines only as told by your doctor.  If you were prescribed an antibiotic medicine, use it as told by your doctor. Do not stop using it even if you start to feel better. Skin care  Apply cool, wet cloths to the hives.  Do not scratch your skin. Do not rub your skin. General instructions  Do not take hot showers or baths. This can make itching worse.  Do not wear tight clothes.  Use sunscreen and wear clothes that cover your skin when you are outside.  Avoid any triggers that cause your hives. Keep a journal to help track what causes your hives. Write down: ? What medicines you take. ? What you eat and drink. ? What products you use  on your skin.  Keep all follow-up visits as told by your doctor. This is important. Contact a doctor if:  Your symptoms are not better with medicine.  Your joints hurt or are swollen. Get help right away if:  You have a fever.  You have pain in your belly (abdomen).  Your tongue or lips are swollen.  Your eyelids are swollen.  Your chest or throat feels tight.  You have trouble breathing or swallowing. These symptoms may be an emergency. Do not wait to see if the symptoms will go away. Get  medical help right away. Call your local emergency services (911 in the [place] Do not drive yourself to the hospital. Summary  Hives are itchy, red, swollen areas on your skin.  Treatment for this condition depends on your symptoms.  Avoid things that cause your hives. Keep a journal to help track what causes your hives.  Take and apply over-the-counter and prescription medicines only as told by your doctor.  Keep all follow-up visits as told by your doctor. This is important. This information is not intended to replace advice given to you by your health care provider. Make sure you discuss any questions you have with your health care provider. Document Revised: 06/06/2018 Document Reviewed: 06/06/2018 Elsevier Patient Education  [employer].

## 2020-06-05 ENCOUNTER — Other Ambulatory Visit: Payer: Self-pay | Admitting: Nurse Practitioner

## 2020-06-05 ENCOUNTER — Encounter: Payer: Self-pay | Admitting: Nurse Practitioner

## 2020-06-05 DIAGNOSIS — L509 Urticaria, unspecified: Secondary | ICD-10-CM

## 2020-06-05 MED ORDER — PREDNISONE 10 MG (48) PO TBPK: ORAL_TABLET | Freq: Every day | ORAL | 0 refills | Status: DC

## 2020-06-14 IMAGING — DX DG SHOULDER 2+V*L*
3 series · 3 of 3 positions shown · non-contrast
Comparison: None.

CLINICAL DATA: Pain

EXAM:
LEFT SHOULDER - 2+ VIEW

[shoulder grashey]
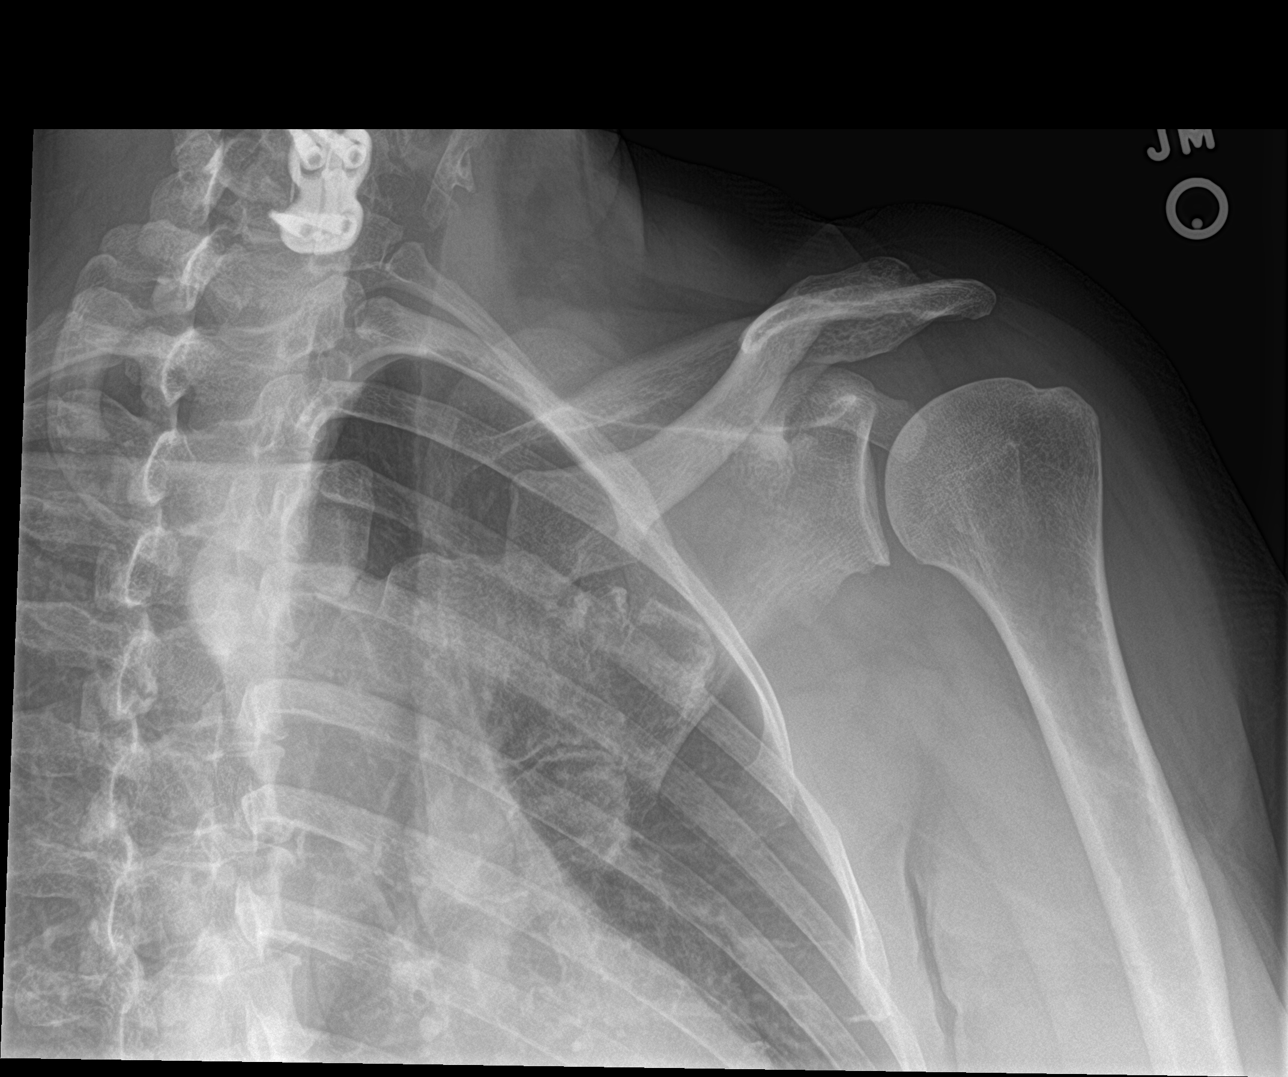

[shoulder y view]
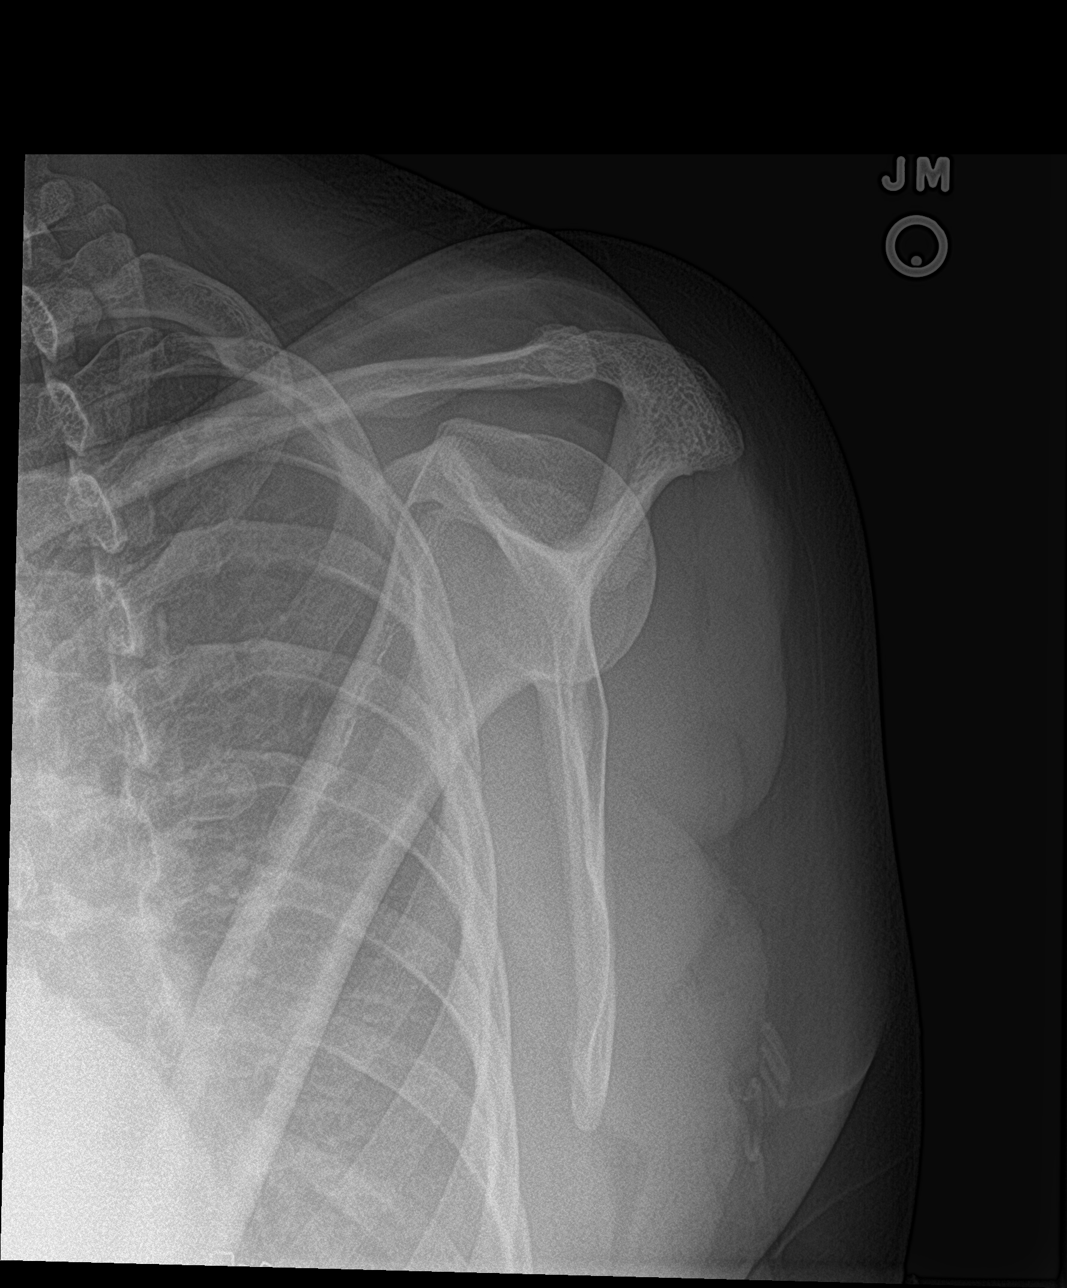

[shoulder axillary]
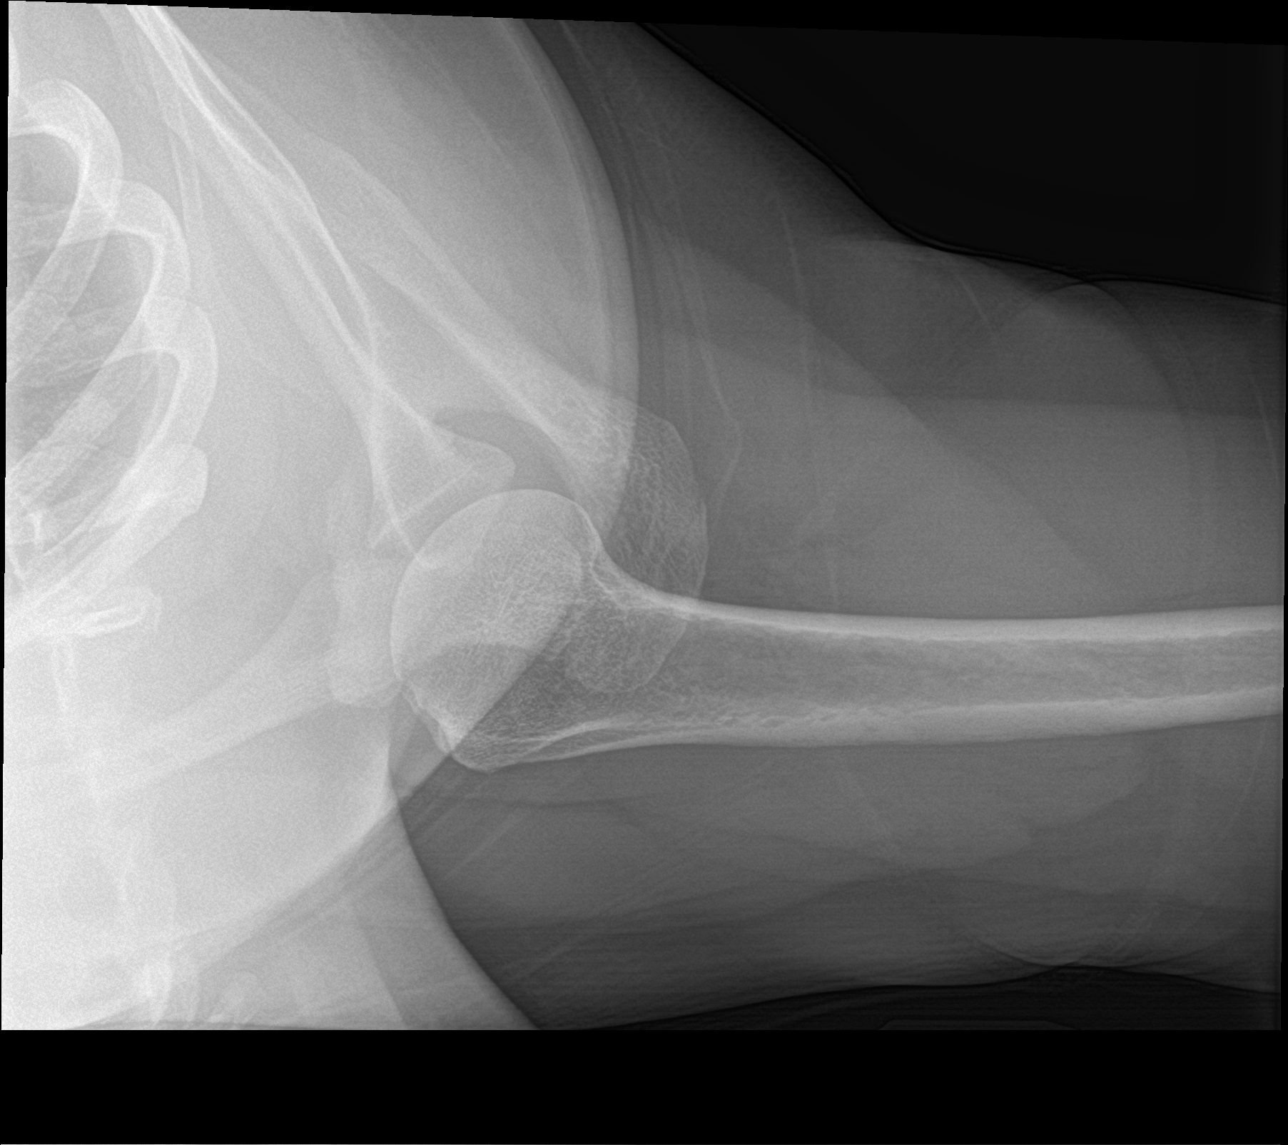

[3 of 3 positions shown; findings below may reference images not displayed]

FINDINGS: Oblique, Y scapular, and axillary images were obtained. No fracture
or dislocation. Joint spaces appear normal. No erosive change.
Visualized left lung clear. There is postoperative change in the
lower cervical region.
IMPRESSION: No fracture or dislocation.  No appreciable arthropathy.

## 2020-06-15 ENCOUNTER — Encounter: Payer: Self-pay | Admitting: Physician Assistant

## 2020-06-15 ENCOUNTER — Ambulatory Visit (INDEPENDENT_AMBULATORY_CARE_PROVIDER_SITE_OTHER): Payer: BC Managed Care – PPO | Admitting: Physician Assistant

## 2020-06-15 VITALS — BP 134/87 | HR 91 | Ht 64.0 in | Wt 197.0 lb

## 2020-06-15 DIAGNOSIS — L508 Other urticaria: Secondary | ICD-10-CM

## 2020-06-15 DIAGNOSIS — E039 Hypothyroidism, unspecified: Secondary | ICD-10-CM | POA: Diagnosis not present

## 2020-06-15 DIAGNOSIS — R140 Abdominal distension (gaseous): Secondary | ICD-10-CM | POA: Diagnosis not present

## 2020-06-15 DIAGNOSIS — L304 Erythema intertrigo: Secondary | ICD-10-CM

## 2020-06-15 DIAGNOSIS — T781XXD Other adverse food reactions, not elsewhere classified, subsequent encounter: Secondary | ICD-10-CM

## 2020-06-15 MED ORDER — FLUCONAZOLE 150 MG PO TABS: ORAL_TABLET | ORAL | 0 refills | Status: DC

## 2020-06-15 MED ORDER — NYSTATIN 100000 UNIT/GM EX POWD: 1.0000 "application " | Freq: Three times a day (TID) | CUTANEOUS | 0 refills | Status: DC

## 2020-06-15 MED ORDER — LEVOTHYROXINE SODIUM 100 MCG PO TABS: 100.0000 ug | ORAL_TABLET | Freq: Every day | ORAL | 1 refills | Status: DC

## 2020-06-15 NOTE — Progress Notes (Signed)
Subjective:    Patient ID: Melinda Browning, female    DOB: 08-05-1966, 54 y.o.   MRN: [id number]  HPI  Pt is a 54 yo female with asthma, allergic rhinitis, adverse food reactions, hypothyroidism who presents to the clinic with ongoing rash after 2nd dose of covid vaccine.   She has been seen by Dr. Ashley Browning and Melinda Browning in the clinic and given 2 rounds of steroids without any improvement. Rash almost resolved after prednisone but when she stopped came right back. Rash is very itching. Taking benadryl. First rash is neck and arms but has a new rash under breast. She reports to feel worse after eating. Has a gluten allergy and trys to avoid but often does eat some gluten. She has lots of bloating after eating. Taking zyrtec and vistaril daily.   She does mention that brand changed on thyroid medication since rash occurred.   She has seen allergy before and brings evaluation.   No tongue or lip swelling, problems breathing or swallowing.   .. Active Ambulatory Problems    Diagnosis Date Noted  . GERD (gastroesophageal reflux disease) 03/22/2018  . Hypothyroidism 03/22/2018  . Fibromyalgia 03/22/2018  . Depression, major, single episode, complete remission (HCC) 03/22/2018  . Class 1 obesity due to excess calories without serious comorbidity with body mass index (BMI) of 32.0 to 32.9 in adult 03/22/2018  . No energy 03/25/2018  . Thyroid nodule 08/03/2018  . Vitamin D deficiency 08/06/2018  . History of C5-C6 cervical ACDF 01/08/2019  . Lumbar degenerative disc disease 01/17/2019  . SOB (shortness of breath) 07/02/2019  . Cough 07/02/2019  . Abnormal computed tomography angiography (CTA) 07/02/2019  . Low serum vitamin B12 08/27/2019  . Pain in joint involving multiple sites 08/27/2019  . Elevated blood pressure reading 09/27/2019  . B12 deficiency 09/27/2019  . Recurrent urticaria 09/27/2019  . Rash 04/28/2020  . Mild persistent asthma 06/17/2020  . Allergic rhinitis with probable  nonallergic component 06/17/2020  . Adverse food reaction 06/17/2020  . Bloating 06/19/2020  . Intertrigo 06/19/2020   Resolved Ambulatory Problems    Diagnosis Date Noted  . Acute pain of left shoulder 01/08/2019  . History of fusion of cervical spine 01/08/2019  . Neck pain 01/09/2019   Past Medical History:  Diagnosis Date  . DDD (degenerative disc disease), lumbar   . Depression      Review of Systems See HPI.     Objective:   Physical Exam Vitals reviewed.  Constitutional:      Appearance: Normal appearance.  HENT:     Mouth/Throat:     Mouth: Mucous membranes are moist.  Cardiovascular:     Rate and Rhythm: Normal rate and regular rhythm.     Pulses: Normal pulses.  Pulmonary:     Effort: Pulmonary effort is normal.     Breath sounds: Normal breath sounds.  Skin:    Comments: Raised macular papular rash on right neck and scattered on arms and legs.   Macular erythematous rash under bilateral breast.   Neurological:     Mental Status: She is alert.  Psychiatric:        Mood and Affect: Mood normal.           Assessment & Plan:  Marland KitchenMarland KitchenArieana was seen today for rash.  Diagnoses and all orders for this visit:  Recurrent urticaria  Acquired hypothyroidism -     levothyroxine (SYNTHROID) 100 MCG tablet; Take 1 tablet (100 mcg total) by mouth daily. Brand  only.  Adverse food reaction, subsequent encounter  Bloating  Intertrigo -     fluconazole (DIFLUCAN) 150 MG tablet; Take one tablet now and then repeat in 48 hours and then once a week for one month. -     nystatin (MYCOSTATIN/NYSTOP) powder; Apply 1 application topically 3 (three) times daily.  Allergy testing shows:  Positive histamine response to MOLD, CAT, WHEAT.   Unclear etiology. Has upcoming allergy appt.  For now BRAND only synthroid to see if that will help.  I feel like 2 different rashes.  One-hive rash and the other some yeast under breast. Sent medication for new rash under breast.   Continue antihistamine.  Right now she feels like work is a trigger and she is having to take anti-histamines that make her really tired. Written out of work for next 10 days.

## 2020-06-16 ENCOUNTER — Encounter: Payer: Self-pay | Admitting: Physician Assistant

## 2020-06-17 ENCOUNTER — Encounter: Payer: Self-pay | Admitting: Allergy and Immunology

## 2020-06-17 ENCOUNTER — Ambulatory Visit: Payer: BC Managed Care – PPO | Admitting: Allergy and Immunology

## 2020-06-17 ENCOUNTER — Other Ambulatory Visit: Payer: Self-pay

## 2020-06-17 ENCOUNTER — Encounter: Payer: Self-pay | Admitting: Physician Assistant

## 2020-06-17 VITALS — BP 128/86 | HR 102 | Temp 98.1°F | Resp 16 | Ht 63.78 in | Wt 197.6 lb

## 2020-06-17 DIAGNOSIS — J4530 Mild persistent asthma, uncomplicated: Secondary | ICD-10-CM | POA: Insufficient documentation

## 2020-06-17 DIAGNOSIS — T781XXA Other adverse food reactions, not elsewhere classified, initial encounter: Secondary | ICD-10-CM | POA: Insufficient documentation

## 2020-06-17 DIAGNOSIS — J3089 Other allergic rhinitis: Secondary | ICD-10-CM | POA: Diagnosis not present

## 2020-06-17 DIAGNOSIS — L500 Allergic urticaria: Secondary | ICD-10-CM

## 2020-06-17 DIAGNOSIS — T781XXD Other adverse food reactions, not elsewhere classified, subsequent encounter: Secondary | ICD-10-CM | POA: Diagnosis not present

## 2020-06-17 MED ORDER — FLOVENT HFA 110 MCG/ACT IN AERO: INHALATION_SPRAY | RESPIRATORY_TRACT | 5 refills | Status: DC

## 2020-06-17 MED ORDER — FLUTICASONE PROPIONATE 50 MCG/ACT NA SUSP
NASAL | 5 refills | Status: DC
Start: 2020-06-17 — End: 2022-05-06

## 2020-06-17 MED ORDER — TRIAMCINOLONE ACETONIDE 0.1 % EX OINT: TOPICAL_OINTMENT | CUTANEOUS | 3 refills | Status: DC

## 2020-06-17 MED ORDER — LEVOCETIRIZINE DIHYDROCHLORIDE 5 MG PO TABS
ORAL_TABLET | ORAL | 5 refills | Status: DC
Start: 2020-06-17 — End: 2021-02-19

## 2020-06-17 MED ORDER — METHYLPREDNISOLONE ACETATE 80 MG/ML IJ SUSP
80.0000 mg | Freq: Once | INTRAMUSCULAR | Status: AC
  Administered 2020-06-17: 80 mg via INTRAMUSCULAR

## 2020-06-17 MED ORDER — FAMOTIDINE 20 MG PO TABS: ORAL_TABLET | ORAL | 5 refills | Status: DC

## 2020-06-17 NOTE — Progress Notes (Signed)
Follow-up Note  RE: Melinda Browning MRN: [id number] DOB: 1966-05-16 Date of Office Visit: 06/17/2020  Primary care provider: Lavada Mesi Referring provider: Orma Render, NP  History of present illness: Melinda Browning is a 54 y.o. female with persistent asthma, allergic rhinoconjunctivitis, and food allergy presenting today with a new problem.  She is previously seen in this clinic for her initial evaluation on October 21, 2019 by Dr. Shaune Leeks. She reports that she received her second [place] COVID-19 vaccine on May 20.  Within 2 or 3 days of receiving the vaccine she developed a rash which has been relatively persistent since that time.  The rash involves her neck, torso, and extremities.  The rashes described as red, raised, and exquisitely pruritic.  She has not experienced concomitant angioedema, cardiopulmonary symptoms, or GI symptoms.  She had been put on prednisone taper initially which provided relief, however the hives returned.  She was put on a second course of prednisone without benefit.  This past week she was prescribed fluconazole and nystatin, however she reports that these medications "made it worse."  Skin testing during her allergy evaluation in November revealed borderline reactivity to wheat as well as some intradermal testing reactivity to molds, cat, and cockroach. Regarding asthma, she reports that she had to discontinue montelukast because this medication made her feel "sleepy and weak."  She has been requiring albuterol rescue 4 times per week on average and has been awakening at night on occasion, particularly due to coughing. She is has no nasal allergy symptom complaints today.  Assessment and plan: Recurrent urticaria Unclear etiology.  Given the timing, it is possible that she experienced mast cell membrane destabilization after the second COVID-19 vaccine.  Recent skin tests to select food allergens revealed borderline reactivity to wheat, otherwise were  negative despite a positive histamine control. NSAIDs and emotional stress commonly exacerbate urticaria but are not the underlying etiology in this case. Physical urticarias are negative by history (i.e. pressure-induced, temperature, vibration, solar, etc.). History and lesions are not consistent with urticaria pigmentosa so I am not suspicious for mastocytosis. There are no concomitant symptoms concerning for anaphylaxis or constitutional symptoms worrisome for an underlying malignancy. We will rule out other potential etiologies with labs. For symptom relief, patient is to take oral antihistamines as directed.  The following labs have been ordered: FCeRI antibody, anti-thyroglobulin antibody, thyroid peroxidase antibody, tryptase, H. pylori serology, CBC, CMP, ESR, ANA, and alpha-gal panel.  The patient will be called with further recommendations after lab results have returned.  Instructions have been discussed and provided for H1/H2 receptor blockade with titration to find lowest effective dose.  A prescription has been provided for levocetirizine (Xyzal), 5 mg daily as needed.  A prescription has been provided for famotidine (Pepcid), 24m twice daily as needed.  To hasten symptom relief, Depo-Medrol 80 mg will be administered in the office after labs have been drawn.  A prescription has been provided for triamcinolone 0.1% ointment to sparingly affected areas twice daily as needed with care to avoid the face, axillae, and groin area.  Should there be a significant increase or change in symptoms, a journal is to be kept recording any foods eaten, beverages consumed, medications taken within a 6 hour period prior to the onset of symptoms, as well as record activities being performed, and environmental conditions. For any symptoms concerning for anaphylaxis, 911 is to be called immediately.  Mild persistent asthma Currently with suboptimal control.  The patient is no  longer taking montelukast  due to perceived side effects.  A prescription has been provided for Flovent (fluticasone) 110 g, 2 inhalations via spacer device twice a day.  Continue albuterol HFA, 1 to 2 inhalations every 4-6 hours if needed.  Subjective and objective measures of pulmonary function will be followed and the treatment plan will be adjusted accordingly.  Allergic rhinitis with probable nonallergic component  Continue appropriate allergen avoidance measures.  Continue fluticasone nasal spray, 2 sprays per nostril daily if needed.  Nasal saline spray (i.e., Simply Saline) or nasal saline lavage (i.e., NeilMed) is recommended as needed and prior to medicated nasal sprays.  Levocetirizine has been prescribed (as above).  Adverse food reaction  Continue avoidance of gluten, pineapple, cherries, and blueberries and have access to epinephrine autoinjectors.   Meds ordered this encounter  Medications  . triamcinolone ointment (KENALOG) 0.1 %    Sig: Apply twice daily to red itchy areas below neck    Dispense:  45 g    Refill:  3  . fluticasone (FLOVENT HFA) 110 MCG/ACT inhaler    Sig: 2 puffs twice daily with spacer to prevent coughing or wheezing.    Dispense:  1 Inhaler    Refill:  5  . levocetirizine (XYZAL) 5 MG tablet    Sig: One tablet twice daily for hives    Dispense:  64 tablet    Refill:  5  . famotidine (PEPCID) 20 MG tablet    Sig: 1 tablet twice daily for hives    Dispense:  60 tablet    Refill:  5  . fluticasone (FLONASE) 50 MCG/ACT nasal spray    Sig: 2 sprays per nostril once daily as needed for stuffy nose.    Dispense:  16 g    Refill:  5    Diagnostics: Spirometry reveals an FVC of 2.92 L and an FEV1 of 2.55 L (93% predicted).  This study was performed while the patient was asymptomatic.  Please see scanned spirometry results for details.    Physical examination: Blood pressure 128/86, pulse (!) 102, temperature 98.1 F (36.7 C), temperature source Oral, resp. rate  16, height 5' 3.78" (1.62 m), weight 197 lb 9.6 oz (89.6 kg), SpO2 99 %.   General: Alert, interactive, in no acute distress. HEENT: TMs pearly gray, turbinates moderately edematous without discharge, post-pharynx mildly erythematous. Neck: Supple without lymphadenopathy. Lungs: Clear to auscultation without wheezing, rhonchi or rales. CV: Normal S1, S2 without murmurs. Skin: Scattered erythematous urticarial type lesions primarily located neck and upper extremities , nonvesicular.  The following portions of the patient's history were reviewed and updated as appropriate: allergies, current medications, past family history, past medical history, past social history, past surgical history and problem list.   Current Outpatient Medications  Medication Sig Dispense Refill  . albuterol (VENTOLIN HFA) 108 (90 Base) MCG/ACT inhaler Inhale 2 puffs into the lungs every 4 (four) hours as needed for wheezing or shortness of breath. 18 g 1  . celecoxib (CELEBREX) 200 MG capsule Take 1 capsule (200 mg total) by mouth 2 (two) times daily. 60 capsule 1  . cetirizine (ZYRTEC) 10 MG tablet Take 1 tablet (10 mg total) by mouth 2 (two) times daily. 60 tablet 1  . clobetasol cream (TEMOVATE) 9.93 % Apply 1 application topically 2 (two) times daily. 60 g 0  . cyclobenzaprine (FLEXERIL) 10 MG tablet Take 0.5-1 tablets (5-10 mg total) by mouth 3 (three) times daily as needed for muscle spasms. Caution: can cause drowsiness 30  tablet 0  . DULoxetine 40 MG CPEP Take 40 mg by mouth daily. 90 capsule 0  . EPINEPHrine 0.3 mg/0.3 mL IJ SOAJ injection Inject 0.3 mLs (0.3 mg total) into the muscle as needed for anaphylaxis. 1 each 1  . famotidine (PEPCID) 20 MG tablet Take 1 tablet twice a day for heartburn. 60 tablet 5  . fluconazole (DIFLUCAN) 150 MG tablet Take one tablet now and then repeat in 48 hours and then once a week for one month. 5 tablet 0  . gabapentin (NEURONTIN) 800 MG tablet Take 1 tablet (800 mg total) by  mouth 3 (three) times daily. 90 tablet 3  . hydrOXYzine (VISTARIL) 25 MG capsule Take 1 capsule (25 mg total) by mouth 3 (three) times daily as needed for itching. 30 capsule 0  . levothyroxine (SYNTHROID) 100 MCG tablet Take 1 tablet (100 mcg total) by mouth daily. Brand only. 30 tablet 1  . montelukast (SINGULAIR) 10 MG tablet Take 1 tablet once a day for coughing or wheezing. 34 tablet 5  . nystatin (MYCOSTATIN/NYSTOP) powder Apply 1 application topically 3 (three) times daily. 15 g 0  . Vitamin D, Ergocalciferol, (DRISDOL) 1.25 MG (50000 UT) CAPS capsule Take 1 capsule (50,000 Units total) by mouth every 7 (seven) days. 12 capsule 1  . famotidine (PEPCID) 20 MG tablet 1 tablet twice daily for hives 60 tablet 5  . fluticasone (FLONASE) 50 MCG/ACT nasal spray 2 sprays per nostril once daily as needed for stuffy nose. 16 g 5  . fluticasone (FLOVENT HFA) 110 MCG/ACT inhaler 2 puffs twice daily with spacer to prevent coughing or wheezing. 1 Inhaler 5  . levocetirizine (XYZAL) 5 MG tablet One tablet twice daily for hives 64 tablet 5  . triamcinolone ointment (KENALOG) 0.1 % Apply twice daily to red itchy areas below neck 45 g 3   No current facility-administered medications for this visit.    Allergies  Allergen Reactions  . Gluten Meal Swelling    ?Angioedema  . Lyrica [Pregabalin] Swelling  . Omnicef [Cefdinir] Swelling    Angioedema  . Pineapple Swelling  . Trintellix [Vortioxetine] Diarrhea and Nausea Only  . Naproxen Rash    Face rash   Review of systems: Review of systems negative except as noted in HPI / PMHx.  Past Medical History:  Diagnosis Date  . DDD (degenerative disc disease), lumbar   . Depression   . Fibromyalgia   . GERD (gastroesophageal reflux disease)   . Hypothyroidism     Family History  Problem Relation Age of Onset  . Hypertension Mother   . Hypertension Sister   . Hypertension Brother   . Asthma Brother   . Allergic rhinitis Son   . Asthma Daughter    . Allergic rhinitis Daughter   . Eczema Daughter   . Angioedema Neg Hx   . Immunodeficiency Neg Hx   . Urticaria Neg Hx     Social History   Socioeconomic History  . Marital status: Widowed    Spouse name: Not on file  . Number of children: Not on file  . Years of education: Not on file  . Highest education level: Not on file  Occupational History  . Not on file  Tobacco Use  . Smoking status: Never Smoker  . Smokeless tobacco: Never Used  Vaping Use  . Vaping Use: Never used  Substance and Sexual Activity  . Alcohol use: Never  . Drug use: Never  . Sexual activity: Not Currently  Other Topics  Concern  . Not on file  Social History Narrative  . Not on file   Social Determinants of Health   Financial Resource Strain:   . Difficulty of Paying Living Expenses:   Food Insecurity:   . Worried About Charity fundraiser in the Last Year:   . Arboriculturist in the Last Year:   Transportation Needs:   . Film/video editor (Medical):   [name] Lack of Transportation (Non-Medical):   Physical Activity:   . Days of Exercise per Week:   . Minutes of Exercise per Session:   Stress:   . Feeling of Stress :   Social Connections:   . Frequency of Communication with Friends and Family:   . Frequency of Social Gatherings with Friends and Family:   . Attends Religious Services:   . Active Member of Clubs or Organizations:   . Attends Archivist Meetings:   [name] Marital Status:   Intimate Partner Violence:   . Fear of Current or Ex-Partner:   . Emotionally Abused:   [name] Physically Abused:   . Sexually Abused:     I appreciate the opportunity to take part in Ezrie's care. Please do not hesitate to contact me with questions.  Sincerely,   R. Edgar Frisk, MD

## 2020-06-17 NOTE — Patient Instructions (Addendum)
Recurrent urticaria Unclear etiology.  Given the timing, it is possible that she experienced mast cell membrane destabilization after the second COVID-19 vaccine.  Recent skin tests to select food allergens revealed borderline reactivity to wheat, otherwise were negative despite a positive histamine control. NSAIDs and emotional stress commonly exacerbate urticaria but are not the underlying etiology in this case. Physical urticarias are negative by history (i.e. pressure-induced, temperature, vibration, solar, etc.). History and lesions are not consistent with urticaria pigmentosa so I am not suspicious for mastocytosis. There are no concomitant symptoms concerning for anaphylaxis or constitutional symptoms worrisome for an underlying malignancy. We will rule out other potential etiologies with labs. For symptom relief, patient is to take oral antihistamines as directed.  The following labs have been ordered: FCeRI antibody, anti-thyroglobulin antibody, thyroid peroxidase antibody, tryptase, H. pylori serology, CBC, CMP, ESR, ANA, and alpha-gal panel.  The patient will be called with further recommendations after lab results have returned.  Instructions have been discussed and provided for H1/H2 receptor blockade with titration to find lowest effective dose.  A prescription has been provided for levocetirizine (Xyzal), 5 mg daily as needed.  A prescription has been provided for famotidine (Pepcid), 27m twice daily as needed.  To hasten symptom relief, Depo-Medrol 80 mg will be administered in the office after labs have been drawn.  A prescription has been provided for triamcinolone 0.1% ointment to sparingly affected areas twice daily as needed with care to avoid the face, axillae, and groin area.  Should there be a significant increase or change in symptoms, a journal is to be kept recording any foods eaten, beverages consumed, medications taken within a 6 hour period prior to the onset of  symptoms, as well as record activities being performed, and environmental conditions. For any symptoms concerning for anaphylaxis, 911 is to be called immediately.  Mild persistent asthma Currently with suboptimal control.  The patient is no longer taking montelukast due to perceived side effects.  A prescription has been provided for Flovent (fluticasone) 110 g, 2 inhalations via spacer device twice a day.  Continue albuterol HFA, 1 to 2 inhalations every 4-6 hours if needed.  Subjective and objective measures of pulmonary function will be followed and the treatment plan will be adjusted accordingly.  Allergic rhinitis with probable nonallergic component  Continue appropriate allergen avoidance measures.  Continue fluticasone nasal spray, 2 sprays per nostril daily if needed.  Nasal saline spray (i.e., Simply Saline) or nasal saline lavage (i.e., NeilMed) is recommended as needed and prior to medicated nasal sprays.  Levocetirizine has been prescribed (as above).  Adverse food reaction  Continue avoidance of gluten, pineapple, cherries, and blueberries and have access to epinephrine autoinjectors.   When lab results have returned you will be called with further recommendations. With the newly implemented Cures Act, the labs may be visible to you at the same time they become visible to [place] However, the results will typically not be addressed until all of the results are back, so please be patient.  Until you have heard from [place] please continue the treatment plan as outlined on your take home sheet.  Urticaria (Hives)  . Levocetirizine (Xyzal) 5 mg twice a day and famotidine (Pepcid) 20 mg twice a day. If no symptoms for 7-14 days then decrease to. . Levocetirizine (Xyzal) 5 mg twice a day and famotidine (Pepcid) 20 mg once a day.  If no symptoms for 7-14 days then decrease to. . Levocetirizine (Xyzal) 5 mg twice a day.  If  no symptoms for 7-14 days then decrease  to. . Levocetirizine (Xyzal) 5 mg once a day.  May use Benadryl (diphenhydramine) as needed for breakthrough symptoms       If symptoms return, then step up dosage

## 2020-06-17 NOTE — Addendum Note (Signed)
Addended by: Vincent Peyer A on: 06/17/2020 03:01 PM   Modules accepted: Orders

## 2020-06-17 NOTE — Assessment & Plan Note (Addendum)
Unclear etiology.  Given the timing, it is possible that she experienced mast cell membrane destabilization after the second COVID-19 vaccine.  Recent skin tests to select food allergens revealed borderline reactivity to wheat, otherwise were negative despite a positive histamine control. NSAIDs and emotional stress commonly exacerbate urticaria but are not the underlying etiology in this case. Physical urticarias are negative by history (i.e. pressure-induced, temperature, vibration, solar, etc.). History and lesions are not consistent with urticaria pigmentosa so I am not suspicious for mastocytosis. There are no concomitant symptoms concerning for anaphylaxis or constitutional symptoms worrisome for an underlying malignancy. We will rule out other potential etiologies with labs. For symptom relief, patient is to take oral antihistamines as directed.  The following labs have been ordered: FCeRI antibody, anti-thyroglobulin antibody, thyroid peroxidase antibody, tryptase, H. pylori serology, CBC, CMP, ESR, ANA, and alpha-gal panel.  The patient will be called with further recommendations after lab results have returned.  Instructions have been discussed and provided for H1/H2 receptor blockade with titration to find lowest effective dose.  A prescription has been provided for levocetirizine (Xyzal), 5 mg daily as needed.  A prescription has been provided for famotidine (Pepcid), 36m twice daily as needed.  To hasten symptom relief, Depo-Medrol 80 mg will be administered in the office after labs have been drawn.  A prescription has been provided for triamcinolone 0.1% ointment to sparingly affected areas twice daily as needed with care to avoid the face, axillae, and groin area.  Should there be a significant increase or change in symptoms, a journal is to be kept recording any foods eaten, beverages consumed, medications taken within a 6 hour period prior to the onset of symptoms, as well as record  activities being performed, and environmental conditions. For any symptoms concerning for anaphylaxis, 911 is to be called immediately.

## 2020-06-17 NOTE — Assessment & Plan Note (Signed)
   Continue appropriate allergen avoidance measures.  Continue fluticasone nasal spray, 2 sprays per nostril daily if needed.  Nasal saline spray (i.e., Simply Saline) or nasal saline lavage (i.e., NeilMed) is recommended as needed and prior to medicated nasal sprays.  Levocetirizine has been prescribed (as above).

## 2020-06-17 NOTE — Assessment & Plan Note (Addendum)
Currently with suboptimal control.  The patient is no longer taking montelukast due to perceived side effects.  A prescription has been provided for Flovent (fluticasone) 110 g, 2 inhalations via spacer device twice a day.  Continue albuterol HFA, 1 to 2 inhalations every 4-6 hours if needed.  Subjective and objective measures of pulmonary function will be followed and the treatment plan will be adjusted accordingly.

## 2020-06-17 NOTE — Assessment & Plan Note (Signed)
   Continue avoidance of gluten, pineapple, cherries, and blueberries and have access to epinephrine autoinjectors.

## 2020-06-19 DIAGNOSIS — L304 Erythema intertrigo: Secondary | ICD-10-CM | POA: Insufficient documentation

## 2020-06-19 DIAGNOSIS — R140 Abdominal distension (gaseous): Secondary | ICD-10-CM | POA: Insufficient documentation

## 2020-06-19 NOTE — Telephone Encounter (Signed)
Spoke with patient and let her know that originals of her form are ready to be picked up.  We have made copies to be scanned and one of these was also faxed.  Patient voiced understanding. Veron Senner,CMA

## 2020-06-23 ENCOUNTER — Telehealth: Payer: Self-pay

## 2020-06-23 NOTE — Telephone Encounter (Signed)
Pt sent my chart message worrying about the two flagged results, she would like you to look at her lab results and get back to her, on the abnormal results.

## 2020-06-24 ENCOUNTER — Ambulatory Visit (INDEPENDENT_AMBULATORY_CARE_PROVIDER_SITE_OTHER): Payer: BC Managed Care – PPO | Admitting: Physician Assistant

## 2020-06-24 ENCOUNTER — Encounter: Payer: Self-pay | Admitting: Physician Assistant

## 2020-06-24 ENCOUNTER — Other Ambulatory Visit: Payer: Self-pay

## 2020-06-24 VITALS — BP 145/87 | HR 82 | Ht 63.5 in | Wt 200.0 lb

## 2020-06-24 DIAGNOSIS — L508 Other urticaria: Secondary | ICD-10-CM

## 2020-06-24 DIAGNOSIS — Z6834 Body mass index (BMI) 34.0-34.9, adult: Secondary | ICD-10-CM | POA: Diagnosis not present

## 2020-06-24 DIAGNOSIS — E6609 Other obesity due to excess calories: Secondary | ICD-10-CM

## 2020-06-24 DIAGNOSIS — E063 Autoimmune thyroiditis: Secondary | ICD-10-CM | POA: Insufficient documentation

## 2020-06-24 LAB — CBC WITH DIFFERENTIAL/PLATELET
Basophils Absolute: 0.1 10*3/uL (ref 0.0–0.2)
Basos: 1 %
EOS (ABSOLUTE): 0.2 10*3/uL (ref 0.0–0.4)
Eos: 2 %
Hematocrit: 46.5 % (ref 34.0–46.6)
Hemoglobin: 15.4 g/dL (ref 11.1–15.9)
Immature Grans (Abs): 0 10*3/uL (ref 0.0–0.1)
Immature Granulocytes: 0 %
Lymphocytes Absolute: 2.7 10*3/uL (ref 0.7–3.1)
Lymphs: 32 %
MCH: 30.5 pg (ref 26.6–33.0)
MCHC: 33.1 g/dL (ref 31.5–35.7)
MCV: 92 fL (ref 79–97)
Monocytes Absolute: 0.7 10*3/uL (ref 0.1–0.9)
Monocytes: 8 %
Neutrophils Absolute: 4.8 10*3/uL (ref 1.4–7.0)
Neutrophils: 57 %
Platelets: 278 10*3/uL (ref 150–450)
RBC: 5.05 x10E6/uL (ref 3.77–5.28)
RDW: 13.5 % (ref 11.7–15.4)
WBC: 8.4 10*3/uL (ref 3.4–10.8)

## 2020-06-24 LAB — COMPREHENSIVE METABOLIC PANEL
ALT: 39 IU/L — ABNORMAL HIGH (ref 0–32)
AST: 24 IU/L (ref 0–40)
Albumin/Globulin Ratio: 1.6 (ref 1.2–2.2)
Albumin: 4.4 g/dL (ref 3.8–4.9)
Alkaline Phosphatase: 119 IU/L (ref 48–121)
BUN/Creatinine Ratio: 19 (ref 9–23)
BUN: 14 mg/dL (ref 6–24)
Bilirubin Total: <0.2 mg/dL (ref 0.0–1.2)
CO2: 23 mmol/L (ref 20–29)
Calcium: 9.7 mg/dL (ref 8.7–10.2)
Chloride: 104 mmol/L (ref 96–106)
Creatinine, Ser: 0.72 mg/dL (ref 0.57–1.00)
GFR calc Af Amer: 111 mL/min/{1.73_m2} (ref >59)
GFR calc non Af Amer: 96 mL/min/{1.73_m2} (ref >59)
Globulin, Total: 2.7 g/dL (ref 1.5–4.5)
Glucose: 84 mg/dL (ref 65–99)
Potassium: 4.5 mmol/L (ref 3.5–5.2)
Sodium: 141 mmol/L (ref 134–144)
Total Protein: 7.1 g/dL (ref 6.0–8.5)

## 2020-06-24 LAB — SEDIMENTATION RATE: Sed Rate: 9 mm/hr (ref 0–40)

## 2020-06-24 LAB — THYROID PEROXIDASE ANTIBODY: Thyroperoxidase Ab SerPl-aCnc: 277 IU/mL — ABNORMAL HIGH (ref 0–34)

## 2020-06-24 LAB — H PYLORI, IGM, IGG, IGA AB
H pylori, IgM Abs: <9 units (ref 0.0–8.9)
H. pylori, IgA Abs: <9 units (ref 0.0–8.9)
H. pylori, IgG AbS: 0.26 Index Value (ref 0.00–0.79)

## 2020-06-24 LAB — TRYPTASE: Tryptase: 5.9 ug/L (ref 2.2–13.2)

## 2020-06-24 LAB — CHRONIC URTICARIA: cu index: <6.4 (ref <10)

## 2020-06-24 LAB — THYROGLOBULIN ANTIBODY: Thyroglobulin Antibody: <1 IU/mL (ref 0.0–0.9)

## 2020-06-24 LAB — ANA W/REFLEX IF POSITIVE: Anti Nuclear Antibody (ANA): NEGATIVE

## 2020-06-24 MED ORDER — WEGOVY 0.25 MG/0.5ML ~~LOC~~ SOAJ: 0.2500 mg | SUBCUTANEOUS | 0 refills | Status: DC

## 2020-06-24 NOTE — Progress Notes (Signed)
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 We need to check TSH in 3 months to check for this pattern.   We could refer to dermatologist for biopsy of rash to get full work up done.   Reiterate Dr. Nunzio Cobbs said to stay on anti-histamines.   Keep an anti-inflammatory diet.   You are good to go back to work.

## 2020-06-24 NOTE — Progress Notes (Signed)
Subjective:    Patient ID: Melinda Browning, female    DOB: [birth year], 54 y.o.   MRN: [id number]  HPI  Patient is a 54 year old obese female with anxiety, depression, fibromyalgia and Hashimoto's thyroid disease who presents to the clinic for follow-up.  Few months ago she presented with a rash that was persistent and would not resolve.  Prednisone did help but would always come back.  She is seeing Dr. Nunzio Cobbs at allergy.  Full work-up has been done and determined it is an autoimmune rash likely to Hashimoto disease.  The rash has now resolved.  She is taking Zyrtec/Pepcid/hydroxyzine as needed.  She was written out of work because of the unknown potential triggers.  She was not found to be significantly allergic to any external or environmental factor.  .. Active Ambulatory Problems    Diagnosis Date Noted  . GERD (gastroesophageal reflux disease) 03/22/2018  . Hypothyroidism 03/22/2018  . Fibromyalgia 03/22/2018  . Depression, major, single episode, complete remission (HCC) 03/22/2018  . Class 1 obesity due to excess calories with serious comorbidity and body mass index (BMI) of 34.0 to 34.9 in adult 03/22/2018  . No energy 03/25/2018  . Thyroid nodule 08/03/2018  . Vitamin D deficiency 08/06/2018  . History of C5-C6 cervical ACDF 01/08/2019  . Lumbar degenerative disc disease 01/17/2019  . SOB (shortness of breath) 07/02/2019  . Cough 07/02/2019  . Abnormal computed tomography angiography (CTA) 07/02/2019  . Low serum vitamin B12 08/27/2019  . Pain in joint involving multiple sites 08/27/2019  . Elevated blood pressure reading 09/27/2019  . B12 deficiency 09/27/2019  . 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 Rash 04/28/2020  . Mild persistent asthma 06/17/2020  . Allergic rhinitis with probable nonallergic component 06/17/2020  . Adverse food reaction 06/17/2020  . Bloating 06/19/2020  . Intertrigo 06/19/2020  . Hashimoto's thyroiditis 06/24/2020   Resolved Ambulatory Problems     Diagnosis Date Noted  . Acute pain of left shoulder 01/08/2019  . History of fusion of cervical spine 01/08/2019  . Neck pain 01/09/2019   Past Medical History:  Diagnosis Date  . DDD (degenerative disc disease), lumbar   . Depression       Review of Systems  All other systems reviewed and are negative.      Objective:   Physical Exam Vitals reviewed.  Constitutional:      Appearance: Normal appearance. She is obese.  HENT:     Head: Normocephalic.  Cardiovascular:     Rate and Rhythm: Normal rate and regular rhythm.  Pulmonary:     Effort: Pulmonary effort is normal.     Breath sounds: Normal breath sounds.  Skin:    Comments: No rash.   Neurological:     General: No focal deficit present.     Mental Status: She is alert and oriented to person, place, and time.  Psychiatric:        Mood and Affect: Mood normal.           Assessment & Plan:  Marland KitchenMarland KitchenElina was seen today for hashimoto's thyroiditis.  Diagnoses and all orders for this visit:  Hashimoto's thyroiditis  Autoimmune urticaria  Class 1 obesity due to excess calories with serious comorbidity and body mass index (BMI) of 34.0 to 34.9 in adult -     Semaglutide-Weight Management (WEGOVY) 0.25 MG/0.5ML SOAJ; Inject 0.25 mg into the skin once a week.   Spent time discussing hashimotos. Continue thyroid medication. Discussed no cure. Anti-inflammatory diets do help. Gave HO. Seems  like the cause of your rash. Rash resolved today. Declined dermatology biopsy. Pt has fibromyalgia pain with negative ANA. Will hold on rheumatology referral.   Stay on zyrtec/pepcid/hydroxizine for urticaria.   Ok to go back to work.   [name].Discussed low carb diet with 1500 calories and 80g of protein.  Exercising at least 150 minutes a week.  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  Discussed options.  Will start wegovy. Discussed side effects.  Call back in 3 weeks with update and will titrate up.  In office in 3 months.    Spent 35 minutes with patient.

## 2020-06-24 NOTE — Patient Instructions (Addendum)
Hypothyroidism  Hypothyroidism is when the thyroid gland does not make enough of certain hormones (it is underactive). The thyroid gland is a small gland located in the lower front part of the neck, just in front of the windpipe (trachea). This gland makes hormones that help control how the body uses food for energy (metabolism) as well as how the heart and brain function. These hormones also play a role in keeping your bones strong. When the thyroid is underactive, it produces too little of the hormones thyroxine (T4) and triiodothyronine (T3). What are the causes? This condition may be caused by:  Hashimoto's disease. This is a disease in which the body's disease-fighting system (immune system) attacks the thyroid gland. This is the most common cause.  Viral infections.  Pregnancy.  Certain medicines.  Birth defects.  Past radiation treatments to the head or neck for cancer.  Past treatment with radioactive iodine.  Past exposure to radiation in the environment.  Past surgical removal of part or all of the thyroid.  Problems with a gland in the center of the brain (pituitary gland).  Lack of enough iodine in the diet. What increases the risk? You are more likely to develop this condition if:  You are female.  You have a family history of thyroid conditions.  You use a medicine called lithium.  You take medicines that affect the immune system (immunosuppressants). What are the signs or symptoms? Symptoms of this condition include:  Feeling as though you have no energy (lethargy).  Not being able to tolerate cold.  Weight gain that is not explained by a change in diet or exercise habits.  Lack of appetite.  Dry skin.  Coarse hair.  Menstrual irregularity.  Slowing of thought processes.  Constipation.  Sadness or depression. How is this diagnosed? This condition may be diagnosed based on:  Your symptoms, your medical history, and a physical exam.  Blood  tests. You may also have imaging tests, such as an ultrasound or MRI. How is this treated? This condition is treated with medicine that replaces the thyroid hormones that your body does not make. After you begin treatment, it may take several weeks for symptoms to go away. Follow these instructions at home:  Take over-the-counter and prescription medicines only as told by your health care provider.  If you start taking any new medicines, tell your health care provider.  Keep all follow-up visits as told by your health care provider. This is important. ? As your condition improves, your dosage of thyroid hormone medicine may change. ? You will need to have blood tests regularly so that your health care provider can monitor your condition. Contact a health care provider if:  Your symptoms do not get better with treatment.  You are taking thyroid replacement medicine and you: ? Sweat a lot. ? Have tremors. ? Feel anxious. ? Lose weight rapidly. ? Cannot tolerate heat. ? Have emotional swings. ? Have diarrhea. ? Feel weak. Get help right away if you have:  Chest pain.  An irregular heartbeat.  A rapid heartbeat.  Difficulty breathing. Summary  Hypothyroidism is when the thyroid gland does not make enough of certain hormones (it is underactive).  When the thyroid is underactive, it produces too little of the hormones thyroxine (T4) and triiodothyronine (T3).  The most common cause is Hashimoto's disease, a disease in which the body's disease-fighting system (immune system) attacks the thyroid gland. The condition can also be caused by viral infections, medicine, pregnancy, or past   radiation treatment to the head or neck.  Symptoms may include weight gain, dry skin, constipation, feeling as though you do not have energy, and not being able to tolerate cold.  This condition is treated with medicine to replace the thyroid hormones that your body does not make. This information  is not intended to replace advice given to you by your health care provider. Make sure you discuss any questions you have with your health care provider. Document Revised: 11/03/2017 Document Reviewed: 11/01/2017 Elsevier Patient Education  2020 [employer] Inc[employer]  

## 2020-06-26 ENCOUNTER — Encounter: Payer: Self-pay | Admitting: Physician Assistant

## 2020-07-15 ENCOUNTER — Ambulatory Visit: Payer: Self-pay | Admitting: Allergy and Immunology

## 2020-07-21 ENCOUNTER — Encounter: Payer: Self-pay | Admitting: Physician Assistant

## 2020-07-23 MED ORDER — WEGOVY 0.5 MG/0.5ML ~~LOC~~ SOAJ: 0.5000 mg | SUBCUTANEOUS | 0 refills | Status: DC

## 2020-07-23 NOTE — Telephone Encounter (Signed)
Accomodation form re-faxed to [employer] at [phone] with confirmation received. Scanned under media if patient needs re-sent in the future.

## 2020-07-23 NOTE — Telephone Encounter (Signed)
Do we have any paperwork on her.

## 2020-07-24 IMAGING — MR MRI CERVICAL SPINE WITHOUT CONTRAST
5 series · 34 of 48 positions shown · non-contrast
Comparison: Radiography 01/08/2019

CLINICAL DATA: Left neck pain and arm pain radiating to the
shoulders and hand.

EXAM:
MRI CERVICAL SPINE WITHOUT CONTRAST
TECHNIQUE: Multiplanar, multisequence MR imaging of the cervical spine was
performed. No intravenous contrast was administered.

[Series 2: T2 · sagittal · 3.0mm · 0.56mm/px · 6 of 13 slices shown (1 of 2)]
[im 1/13]
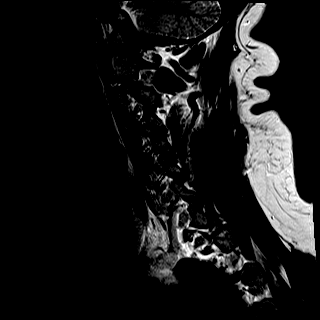
[im 3/13]
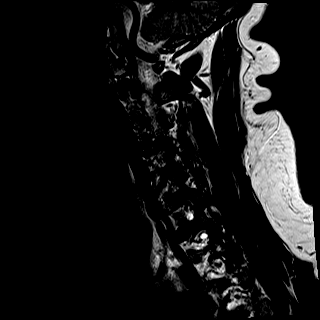
[im 5/13]
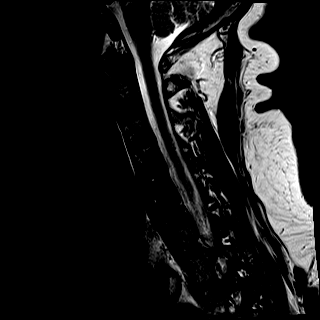
[im 8/13]
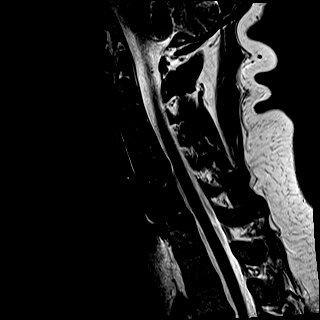
[im 10/13]
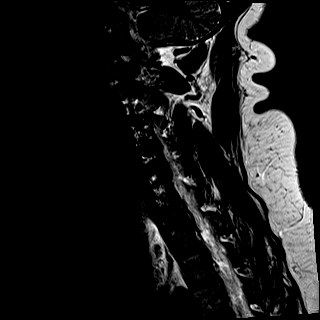
[im 13/13]
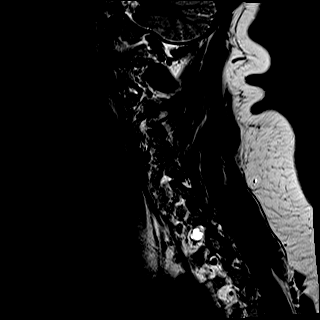

[Series 3: T1 · sagittal · 3.0mm · 0.70mm/px · 7 of 13 slices shown]
[im 1/13]
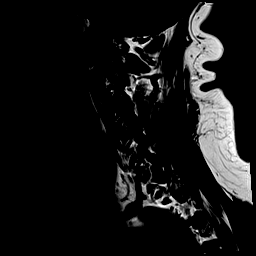
[im 3/13]
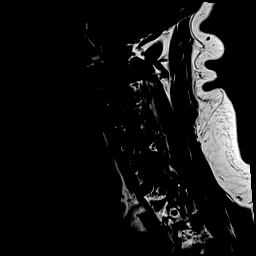
[im 5/13]
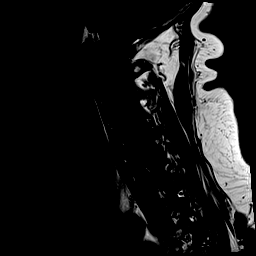
[im 7/13]
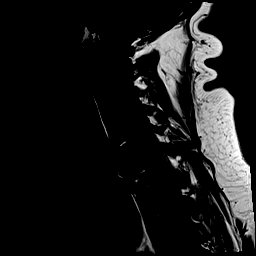
[im 9/13]
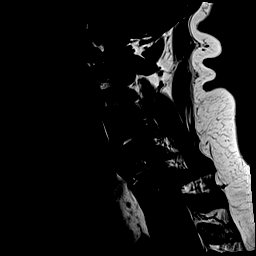
[im 11/13]
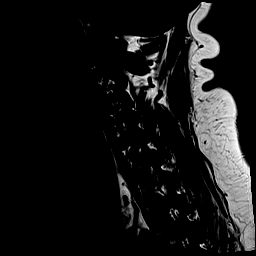
[im 13/13]
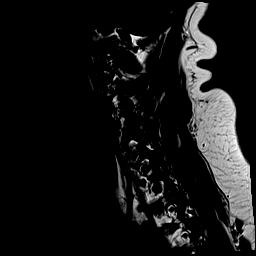

[Series 4: STIR · sagittal · 3.0mm · 0.35mm/px · 7 of 13 slices shown]
[im 1/13]
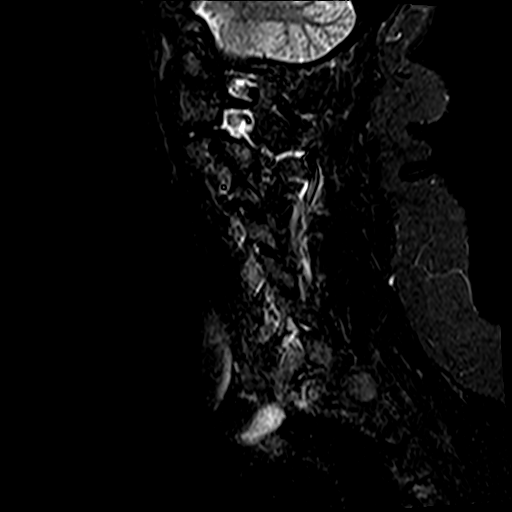
[im 3/13]
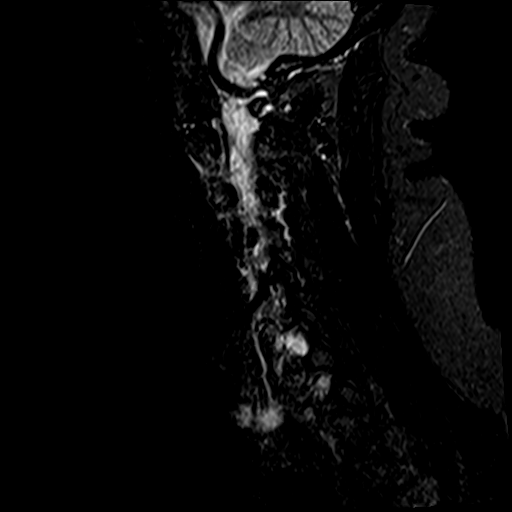
[im 5/13]
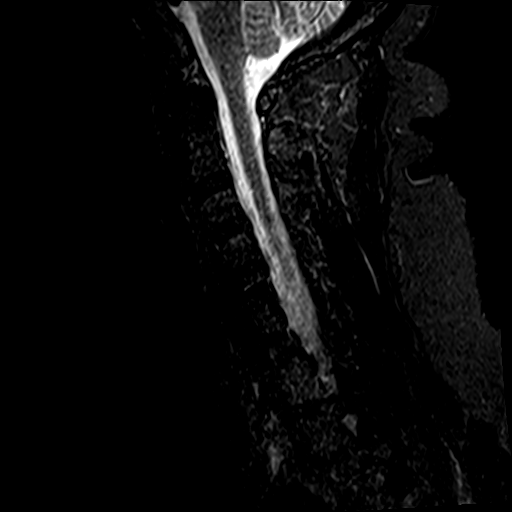
[im 7/13]
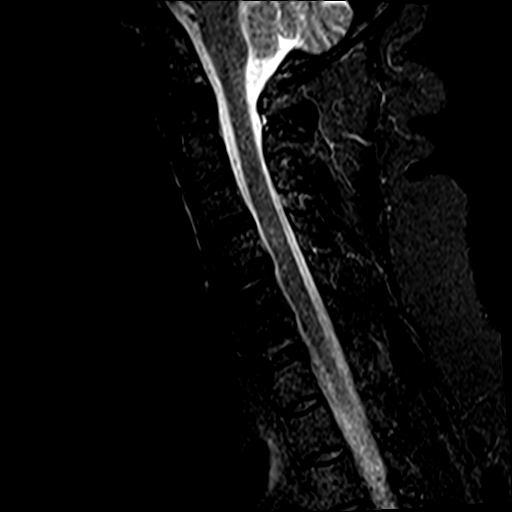
[im 9/13]
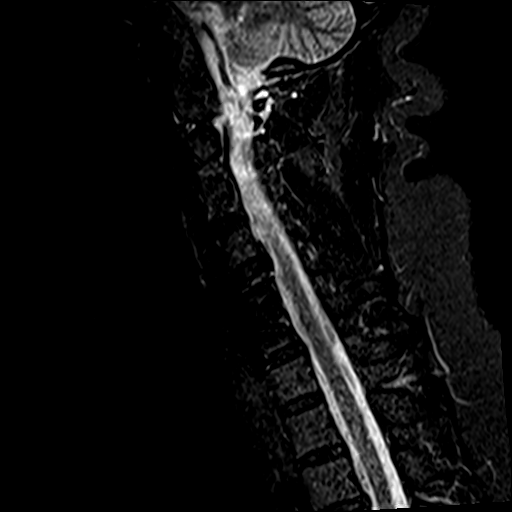
[im 11/13]
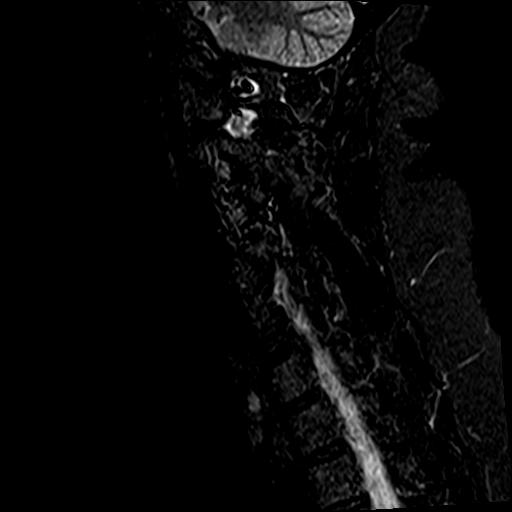
[im 13/13]
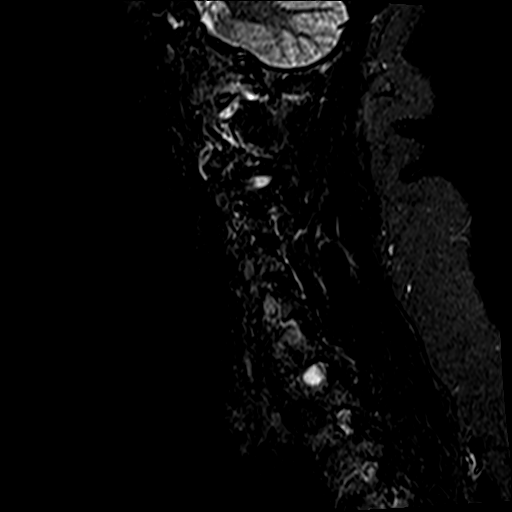

[Series 5: T2 · axial · 3.0mm · 0.62mm/px · z∈[-101,-4]mm · 8 of 28 slices shown (2 of 2)]
[im 1/28]
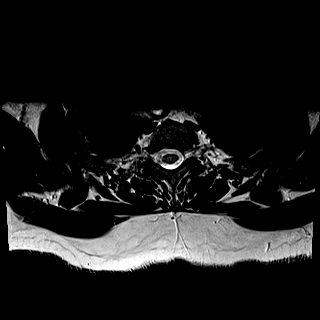
[im 5/28]
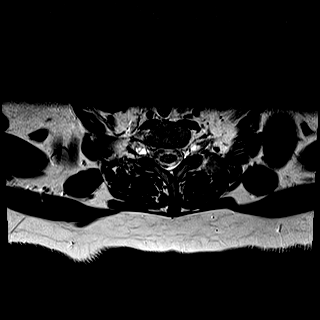
[im 9/28]
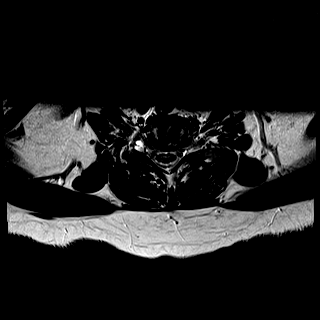
[im 13/28]
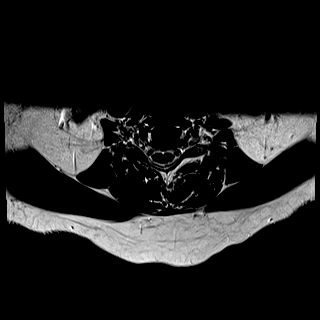
[im 15/28]
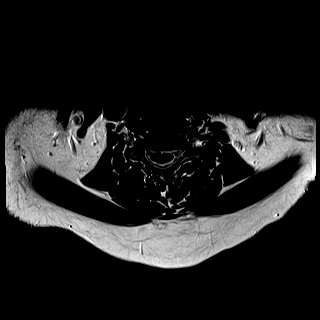
[im 19/28]
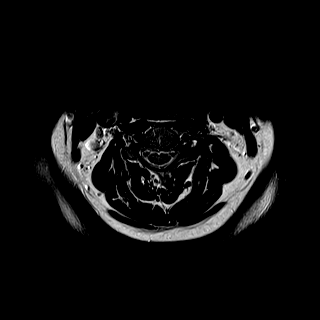
[im 23/28]
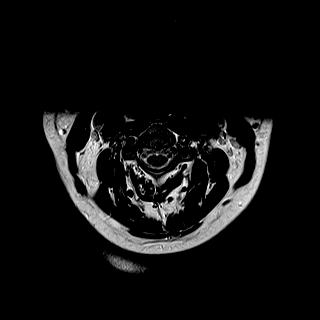
[im 28/28]
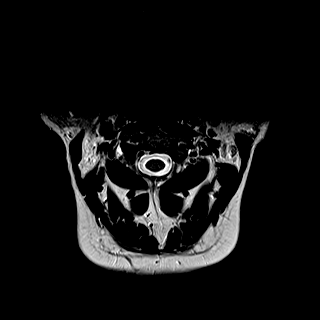

[Series 6: mpgr ax · axial · 3.0mm · 0.35mm/px · z∈[-88,-23]mm · 6 of 28 slices shown]
[im 1/28]
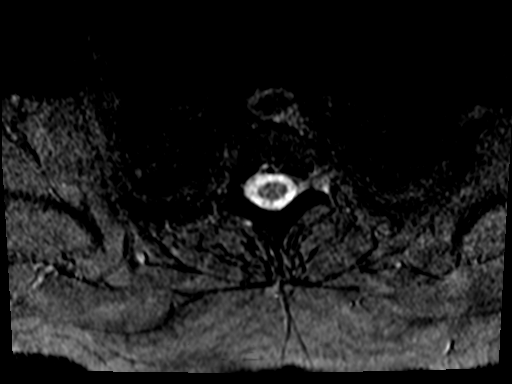
[im 5/28]
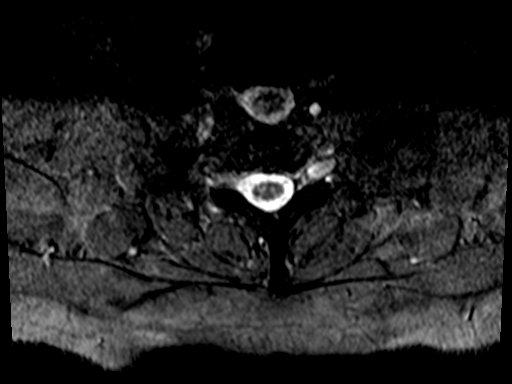
[im 9/28]
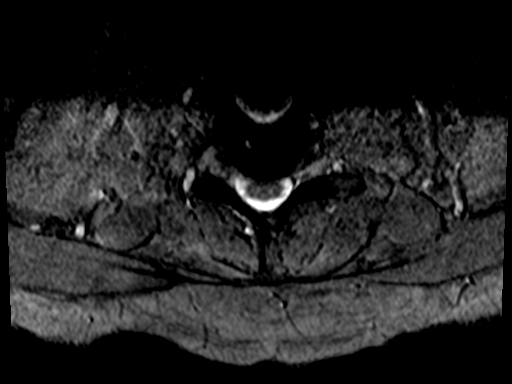
[im 13/28]
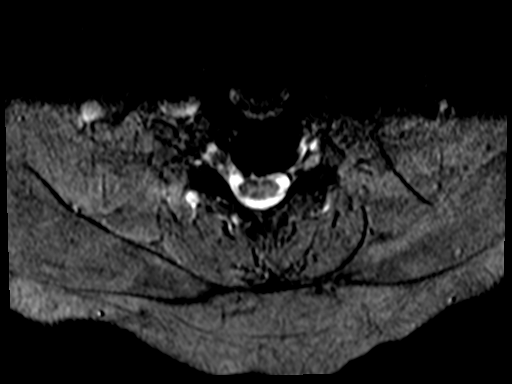
[im 15/28]
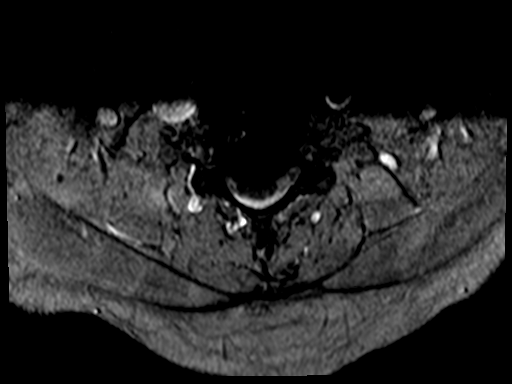
[im 19/28]
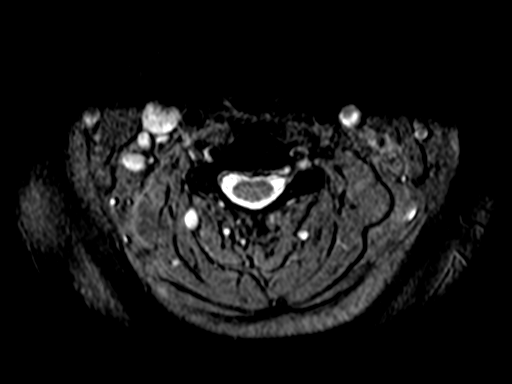

[34 of 48 positions shown; findings below may reference images not displayed]

FINDINGS: Alignment: Straightening of the normal cervical lordosis.

Vertebrae: Previous ACDF C5-6.  Other vertebrae appear normal.

Cord: No cord compression or primary cord lesion.

Posterior Fossa, vertebral arteries, paraspinal tissues: Negative

Disc levels:

No abnormality at the foramen magnum, C1-2 or C2-3.

Minimal non-compressive disc bulges at C3-4 and C4-5. Mild left
foraminal narrowing at C4-5 because of uncovertebral osteophytes,
but no visible neural compression.

C5-6: Good appearance following ACDF.

C6-7: Mild noncompressive disc bulge. No canal or foraminal
stenosis.

C7-T1: Normal interspace.
IMPRESSION: Good appearance at the ACDF level of C5-6.

Mild left foraminal narrowing at C4-5 because of small uncovertebral
osteophytes. No gross nerve compression however.

Minimal non-compressive disc bulges at C3-4 and C6-7.

## 2020-07-29 ENCOUNTER — Other Ambulatory Visit: Payer: Self-pay | Admitting: Medical-Surgical

## 2020-07-29 DIAGNOSIS — M797 Fibromyalgia: Secondary | ICD-10-CM

## 2020-07-29 NOTE — Telephone Encounter (Signed)
Routing to PCP

## 2020-07-31 ENCOUNTER — Telehealth: Payer: Self-pay

## 2020-07-31 NOTE — Telephone Encounter (Signed)
Patient called stating Melinda Browning has misplaced her paperwork that was faxed back in July and wanting it faxed again.   I have printed forms from media tab and re-faxed

## 2020-08-03 ENCOUNTER — Encounter: Payer: Self-pay | Admitting: Physician Assistant

## 2020-08-03 ENCOUNTER — Ambulatory Visit (INDEPENDENT_AMBULATORY_CARE_PROVIDER_SITE_OTHER): Payer: BC Managed Care – PPO | Admitting: Physician Assistant

## 2020-08-03 VITALS — BP 144/89 | HR 80 | Temp 97.9°F | Ht 63.5 in | Wt 193.0 lb

## 2020-08-03 DIAGNOSIS — T887XXA Unspecified adverse effect of drug or medicament, initial encounter: Secondary | ICD-10-CM | POA: Diagnosis not present

## 2020-08-03 DIAGNOSIS — R1013 Epigastric pain: Secondary | ICD-10-CM

## 2020-08-03 DIAGNOSIS — K219 Gastro-esophageal reflux disease without esophagitis: Secondary | ICD-10-CM

## 2020-08-03 MED ORDER — HYOSCYAMINE SULFATE 0.125 MG PO TBDP
0.1250 mg | ORAL_TABLET | Freq: Once | ORAL | Status: AC
  Administered 2020-08-03: 0.125 mg via SUBLINGUAL

## 2020-08-03 MED ORDER — ALUM & MAG HYDROXIDE-SIMETH 200-200-20 MG/5ML PO SUSP
30.0000 mL | Freq: Once | ORAL | Status: AC
  Administered 2020-08-03: 30 mL via ORAL

## 2020-08-03 MED ORDER — LIDOCAINE VISCOUS HCL 2 % MT SOLN
15.0000 mL | Freq: Once | OROMUCOSAL | Status: AC
  Administered 2020-08-03: 15 mL via OROMUCOSAL

## 2020-08-03 MED ORDER — PANTOPRAZOLE SODIUM 40 MG PO TBEC: 40.0000 mg | DELAYED_RELEASE_TABLET | Freq: Every day | ORAL | 3 refills | Status: DC

## 2020-08-03 NOTE — Patient Instructions (Addendum)
Likely Side effect of Wegovy. Need to rule out pancreatitis with labs. GI cocktail in office. Start prontix daily but after thyroid medication.    Food Choices for Gastroesophageal Reflux Disease, Adult When you have gastroesophageal reflux disease (GERD), the foods you eat and your eating habits are very important. Choosing the right foods can help ease your discomfort. Think about working with a nutrition specialist (dietitian) to help you make good choices. What are tips for following this plan?  Meals  Choose healthy foods that are low in fat, such as fruits, vegetables, whole grains, low-fat dairy products, and lean meat, fish, and poultry.  Eat small meals often instead of 3 large meals a day. Eat your meals slowly, and in a place where you are relaxed. Avoid bending over or lying down until 2-3 hours after eating.  Avoid eating meals 2-3 hours before bed.  Avoid drinking a lot of liquid with meals.  Cook foods using methods other than frying. Bake, grill, or broil food instead.  Avoid or limit: ? Chocolate. ? Peppermint or spearmint. ? Alcohol. ? Pepper. ? Black and decaffeinated coffee. ? Black and decaffeinated tea. ? Bubbly (carbonated) soft drinks. ? Caffeinated energy drinks and soft drinks.  Limit high-fat foods such as: ? Fatty meat or fried foods. ? Whole milk, cream, butter, or ice cream. ? Nuts and nut butters. ? Pastries, donuts, and sweets made with butter or shortening.  Avoid foods that cause symptoms. These foods may be different for everyone. Common foods that cause symptoms include: ? Tomatoes. ? Oranges, lemons, and limes. ? Peppers. ? Spicy food. ? Onions and garlic. ? Vinegar. Lifestyle  Maintain a healthy weight. Ask your doctor what weight is healthy for you. If you need to lose weight, work with your doctor to do so safely.  Exercise for at least 30 minutes for 5 or more days each week, or as told by your doctor.  Wear loose-fitting  clothes.  Do not smoke. If you need help quitting, ask your doctor.  Sleep with the head of your bed higher than your feet. Use a wedge under the mattress or blocks under the bed frame to raise the head of the bed. Summary  When you have gastroesophageal reflux disease (GERD), food and lifestyle choices are very important in easing your symptoms.  Eat small meals often instead of 3 large meals a day. Eat your meals slowly, and in a place where you are relaxed.  Limit high-fat foods such as fatty meat or fried foods.  Avoid bending over or lying down until 2-3 hours after eating.  Avoid peppermint and spearmint, caffeine, alcohol, and chocolate. This information is not intended to replace advice given to you by your health care provider. Make sure you discuss any questions you have with your health care provider. Document Revised: 03/14/2019 Document Reviewed: 12/27/2016 Elsevier Patient Education  2020 [employer].

## 2020-08-03 NOTE — Progress Notes (Signed)
Subjective:    Patient ID: [name], female    DOB: 22-Jun-1966, 54 y.o.   MRN: [id number]  HPI  Pt is a 54 yo obese female presenting to the clinic with worsening reflux, upper abdominal pain, nausea for the last month or so. No vomiting. She taste the acid coming back up into mouth. She is taking pepcid but does not help. It is making it hard to take her other pills. She did just start wegovy about 6 weeks ago for weight loss. Denies any stools changes or hematochezia or melena. She is also on celebrex for fibromyalgia.   .. Active Ambulatory Problems    Diagnosis Date Noted  . GERD (gastroesophageal reflux disease) 03/22/2018  . Hypothyroidism 03/22/2018  . Fibromyalgia 03/22/2018  . Depression, major, single episode, complete remission (HCC) 03/22/2018  . Class 1 obesity due to excess calories with serious comorbidity and body mass index (BMI) of 34.0 to 34.9 in adult 03/22/2018  . No energy 03/25/2018  . Thyroid nodule 08/03/2018  . Vitamin D deficiency 08/06/2018  . History of C5-C6 cervical ACDF 01/08/2019  . Lumbar degenerative disc disease 01/17/2019  . SOB (shortness of breath) 07/02/2019  . Cough 07/02/2019  . Abnormal computed tomography angiography (CTA) 07/02/2019  . Low serum vitamin B12 08/27/2019  . Pain in joint involving multiple sites 08/27/2019  . Elevated blood pressure reading 09/27/2019  . B12 deficiency 09/27/2019  . 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 Rash 04/28/2020  . Mild persistent asthma 06/17/2020  . Allergic rhinitis with probable nonallergic component 06/17/2020  . Adverse food reaction 06/17/2020  . Bloating 06/19/2020  . Intertrigo 06/19/2020  . Hashimoto's thyroiditis 06/24/2020  . Medication side effect 08/03/2020   Resolved Ambulatory Problems    Diagnosis Date Noted  . Acute pain of left shoulder 01/08/2019  . History of fusion of cervical spine 01/08/2019  . Neck pain 01/09/2019   Past Medical History:  Diagnosis Date  . DDD  (degenerative disc disease), lumbar   . Depression      Review of Systems See HPI.     Objective:   Physical Exam Vitals reviewed.  Constitutional:      Appearance: Normal appearance. She is obese.  Cardiovascular:     Rate and Rhythm: Normal rate and regular rhythm.     Pulses: Normal pulses.  Pulmonary:     Effort: Pulmonary effort is normal.     Breath sounds: Normal breath sounds.  Abdominal:     General: There is no distension.     Palpations: Abdomen is soft.     Tenderness: There is abdominal tenderness. There is left CVA tenderness. There is no right CVA tenderness, guarding or rebound.     Comments: Upper abdominal tenderness.   Neurological:     General: No focal deficit present.     Mental Status: She is alert and oriented to person, place, and time.  Psychiatric:        Mood and Affect: Mood normal.           Assessment & Plan:  Marland KitchenMarland KitchenChailyn was seen today for gastroesophageal reflux.  Diagnoses and all orders for this visit:  Epigastric abdominal pain -     COMPLETE METABOLIC PANEL WITH GFR -     Lipase -     CBC -     alum & mag hydroxide-simeth (MAALOX/MYLANTA) 200-200-20 MG/5ML suspension 30 mL -     hyoscyamine (ANASPAZ) disintergrating tablet 0.125 mg -     lidocaine (XYLOCAINE)  2 % viscous mouth solution 15 mL  Gastroesophageal reflux disease, unspecified whether esophagitis present -     pantoprazole (PROTONIX) 40 MG tablet; Take 1 tablet (40 mg total) by mouth daily. -     COMPLETE METABOLIC PANEL WITH GFR -     Lipase -     CBC -     alum & mag hydroxide-simeth (MAALOX/MYLANTA) 200-200-20 MG/5ML suspension 30 mL -     hyoscyamine (ANASPAZ) disintergrating tablet 0.125 mg -     lidocaine (XYLOCAINE) 2 % viscous mouth solution 15 mL  Medication side effect   Suspect side effect from wegovy to make GERD worse. Need to rule out pancreatis. Ordered CBC/lipase/cmp. GI cocktail given in clinic today. protontix given to use with pepcid. Stay at  same dose of wegovy. If no pancreatitis may continue wegovy but may need to titrate slower. Consider celebrex and gastritis if continues to worsen as well. Discussed GERD diet.  Follow up in 3 weeks.

## 2020-08-04 LAB — COMPLETE METABOLIC PANEL WITH GFR
AG Ratio: 1.9 (calc) (ref 1.0–2.5)
ALT: 20 U/L (ref 6–29)
AST: 18 U/L (ref 10–35)
Albumin: 4.5 g/dL (ref 3.6–5.1)
Alkaline phosphatase (APISO): 98 U/L (ref 37–153)
BUN: 7 mg/dL (ref 7–25)
CO2: 26 mmol/L (ref 20–32)
Calcium: 9.2 mg/dL (ref 8.6–10.4)
Chloride: 104 mmol/L (ref 98–110)
Creat: 0.58 mg/dL (ref 0.50–1.05)
GFR, Est African American: 122 mL/min/{1.73_m2} (ref >=60)
GFR, Est Non African American: 105 mL/min/{1.73_m2} (ref >=60)
Globulin: 2.4 g/dL (calc) (ref 1.9–3.7)
Glucose, Bld: 89 mg/dL (ref 65–99)
Potassium: 4.1 mmol/L (ref 3.5–5.3)
Sodium: 137 mmol/L (ref 135–146)
Total Bilirubin: 0.4 mg/dL (ref 0.2–1.2)
Total Protein: 6.9 g/dL (ref 6.1–8.1)

## 2020-08-04 LAB — CBC
HCT: 42.1 % (ref 35.0–45.0)
Hemoglobin: 14.6 g/dL (ref 11.7–15.5)
MCH: 31.4 pg (ref 27.0–33.0)
MCHC: 34.7 g/dL (ref 32.0–36.0)
MCV: 90.5 fL (ref 80.0–100.0)
MPV: 10.3 fL (ref 7.5–12.5)
Platelets: 287 10*3/uL (ref 140–400)
RBC: 4.65 10*6/uL (ref 3.80–5.10)
RDW: 13.1 % (ref 11.0–15.0)
WBC: 8.4 10*3/uL (ref 3.8–10.8)

## 2020-08-04 LAB — LIPASE: Lipase: 26 U/L (ref 7–60)

## 2020-08-04 NOTE — Progress Notes (Signed)
Tamico,   No pancreatitis so I think the worsening reflux is likely due to wegovy. Hopefully protonix will help. We might want to consider not titrating up on dose of wegovy until you are feeling better.

## 2020-08-19 ENCOUNTER — Ambulatory Visit (INDEPENDENT_AMBULATORY_CARE_PROVIDER_SITE_OTHER): Payer: BC Managed Care – PPO | Admitting: Physician Assistant

## 2020-08-19 VITALS — BP 132/78 | HR 76 | Temp 97.6°F | Ht 63.5 in | Wt 198.0 lb

## 2020-08-19 DIAGNOSIS — E6609 Other obesity due to excess calories: Secondary | ICD-10-CM | POA: Diagnosis not present

## 2020-08-19 DIAGNOSIS — Z6834 Body mass index (BMI) 34.0-34.9, adult: Secondary | ICD-10-CM

## 2020-08-19 DIAGNOSIS — J0140 Acute pansinusitis, unspecified: Secondary | ICD-10-CM

## 2020-08-19 DIAGNOSIS — K219 Gastro-esophageal reflux disease without esophagitis: Secondary | ICD-10-CM

## 2020-08-19 MED ORDER — AMOXICILLIN-POT CLAVULANATE 875-125 MG PO TABS: 1.0000 | ORAL_TABLET | Freq: Two times a day (BID) | ORAL | 0 refills | Status: DC

## 2020-08-19 NOTE — Progress Notes (Signed)
Subjective:    Patient ID: Melinda Browning, female    DOB: 1965/12/07, 54 y.o.   MRN: [id number]  HPI  Pt is a 54 yo female with GERD who presents to the clinic to follow up on epigastric pain. She is doing much better with reflux and pain since stopping wegovy and being on protonix. She now is having more sinus pressure, ear pain, congestion, headache. No fever, chills, ST, cough, SOB, loss of smell or taste, or GI symptoms. She feels like this is a sinus infection. She is taking her allergy medications and flonase. OTC tylenol cold sinus severe helps a little. No covid exposure. No sick contacts. Pt is vaccinated. Greater than 10 days of symptoms.   She wants to know if she can restart wegovy.  .. Active Ambulatory Problems    Diagnosis Date Noted  . GERD (gastroesophageal reflux disease) 03/22/2018  . Hypothyroidism 03/22/2018  . Fibromyalgia 03/22/2018  . Depression, major, single episode, complete remission (HCC) 03/22/2018  . Class 1 obesity due to excess calories with serious comorbidity and body mass index (BMI) of 34.0 to 34.9 in adult 03/22/2018  . No energy 03/25/2018  . Thyroid nodule 08/03/2018  . Vitamin D deficiency 08/06/2018  . History of C5-C6 cervical ACDF 01/08/2019  . Lumbar degenerative disc disease 01/17/2019  . SOB (shortness of breath) 07/02/2019  . Cough 07/02/2019  . Abnormal computed tomography angiography (CTA) 07/02/2019  . Low serum vitamin B12 08/27/2019  . Pain in joint involving multiple sites 08/27/2019  . Elevated blood pressure reading 09/27/2019  . B12 deficiency 09/27/2019  . 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 Rash 04/28/2020  . Mild persistent asthma 06/17/2020  . Allergic rhinitis with probable nonallergic component 06/17/2020  . Adverse food reaction 06/17/2020  . Bloating 06/19/2020  . Intertrigo 06/19/2020  . Hashimoto's thyroiditis 06/24/2020  . Medication side effect 08/03/2020   Resolved Ambulatory Problems    Diagnosis Date Noted   . Acute pain of left shoulder 01/08/2019  . History of fusion of cervical spine 01/08/2019  . Neck pain 01/09/2019   Past Medical History:  Diagnosis Date  . DDD (degenerative disc disease), lumbar   . Depression        Review of Systems See HPI.     Objective:   Physical Exam Vitals reviewed.  Constitutional:      Appearance: Normal appearance. She is obese.  HENT:     Head: Normocephalic.     Comments: Tenderness to palpation over maxillary/frontal/ethmoid sinuses.      Right Ear: Ear canal and external ear normal. There is no impacted cerumen.     Left Ear: Tympanic membrane, ear canal and external ear normal. There is no impacted cerumen.     Ears:     Comments: Right TM erythematous and dull to light reflex.     Nose: Nose normal.     Mouth/Throat:     Pharynx: Oropharynx is clear.  Eyes:     Conjunctiva/sclera: Conjunctivae normal.  Cardiovascular:     Rate and Rhythm: Normal rate and regular rhythm.     Pulses: Normal pulses.  Pulmonary:     Effort: Pulmonary effort is normal.  Abdominal:     General: There is no distension.     Palpations: Abdomen is soft.     Tenderness: There is no abdominal tenderness.  Neurological:     General: No focal deficit present.     Mental Status: She is alert.  Psychiatric:  Mood and Affect: Mood normal.           Assessment & Plan:  Marland KitchenMarland KitchenAnnibelle was seen today for gastroesophageal reflux.  Diagnoses and all orders for this visit:  Acute non-recurrent pansinusitis -     amoxicillin-clavulanate (AUGMENTIN) 875-125 MG tablet; Take 1 tablet by mouth 2 (two) times daily.  Gastroesophageal reflux disease, unspecified whether esophagitis present  Class 1 obesity due to excess calories with serious comorbidity and body mass index (BMI) of 34.0 to 34.9 in adult   GERD symptoms and epigastric pain resolved. She could add back in wegovy but if GI symptoms return needs to discontinue. Stay on protonix for now. Continue  regular exercise and diet control.   Greater than 10 days of sinus symptoms. Sent augmentin for 10 days. Stay hydrated.

## 2020-08-19 NOTE — Patient Instructions (Signed)

## 2020-09-08 ENCOUNTER — Encounter: Payer: Self-pay | Admitting: Physician Assistant

## 2020-09-08 ENCOUNTER — Ambulatory Visit (INDEPENDENT_AMBULATORY_CARE_PROVIDER_SITE_OTHER): Payer: BC Managed Care – PPO | Admitting: Physician Assistant

## 2020-09-08 VITALS — BP 149/89 | HR 85 | Ht 63.5 in | Wt 199.0 lb

## 2020-09-08 DIAGNOSIS — M797 Fibromyalgia: Secondary | ICD-10-CM | POA: Diagnosis not present

## 2020-09-08 DIAGNOSIS — E039 Hypothyroidism, unspecified: Secondary | ICD-10-CM | POA: Diagnosis not present

## 2020-09-08 DIAGNOSIS — R519 Headache, unspecified: Secondary | ICD-10-CM

## 2020-09-08 DIAGNOSIS — M25475 Effusion, left foot: Secondary | ICD-10-CM

## 2020-09-08 DIAGNOSIS — M25472 Effusion, left ankle: Secondary | ICD-10-CM

## 2020-09-08 DIAGNOSIS — M25474 Effusion, right foot: Secondary | ICD-10-CM | POA: Diagnosis not present

## 2020-09-08 DIAGNOSIS — R030 Elevated blood-pressure reading, without diagnosis of hypertension: Secondary | ICD-10-CM | POA: Diagnosis not present

## 2020-09-08 DIAGNOSIS — M25471 Effusion, right ankle: Secondary | ICD-10-CM | POA: Diagnosis not present

## 2020-09-08 LAB — T3, FREE: T3, Free: 3.1 pg/mL (ref 2.3–4.2)

## 2020-09-08 LAB — TSH: TSH: 1.68 mIU/L

## 2020-09-08 LAB — T4, FREE: Free T4: 1 ng/dL (ref 0.8–1.8)

## 2020-09-08 LAB — POCT UA - MICROALBUMIN
Albumin/Creatinine Ratio, Urine, POC: <30
Creatinine, POC: 50 mg/dL
Microalbumin Ur, POC: 10 mg/L

## 2020-09-08 MED ORDER — HYDROCHLOROTHIAZIDE 12.5 MG PO TABS: 12.5000 mg | ORAL_TABLET | Freq: Every day | ORAL | 1 refills | Status: DC

## 2020-09-08 NOTE — Progress Notes (Signed)
Patient ID: Melinda Browning, female   DOB: 01-Mar-1966, 54 y.o.   MRN: [id number]

## 2020-09-08 NOTE — Progress Notes (Signed)
Subjective:    Patient ID: Melinda Browning, female    DOB: Apr 22, 1966, 54 y.o.   MRN: [id number]  HPI Patient is a 54 year old obese female with hypothyroidism, fibromyalgia who presents to the clinic with bilateral lower extremity swelling.  Patient has noticed the swelling for the last few days.  She feels swollen in her eyes, hands, feet.  She denies any chest pain, palpitations, dizziness.  She does have a acute headache that has been persistent for the last 2 days.  She does not have a history of migraines or headaches.  She has asthma which is controlled and no significant changes in shortness of breath.  Her swelling tends to get worse as the day goes on.  She has not tried anything to make better.  Her daughter did hit the back of her heel the other day and she thought that was where the swelling was coming from until both ankles swelled.  She denies any fever, chills, body aches.  Patient denies any new start of medications.  Patient has not taken the Celebrex due to increased swelling.  .. Active Ambulatory Problems    Diagnosis Date Noted  . GERD (gastroesophageal reflux disease) 03/22/2018  . Hypothyroidism 03/22/2018  . Fibromyalgia 03/22/2018  . Depression, major, single episode, complete remission (HCC) 03/22/2018  . Class 1 obesity due to excess calories with serious comorbidity and body mass index (BMI) of 34.0 to 34.9 in adult 03/22/2018  . No energy 03/25/2018  . Thyroid nodule 08/03/2018  . Vitamin D deficiency 08/06/2018  . History of C5-C6 cervical ACDF 01/08/2019  . Lumbar degenerative disc disease 01/17/2019  . SOB (shortness of breath) 07/02/2019  . Cough 07/02/2019  . Abnormal computed tomography angiography (CTA) 07/02/2019  . Low serum vitamin B12 08/27/2019  . Pain in joint involving multiple sites 08/27/2019  . Elevated blood pressure reading 09/27/2019  . B12 deficiency 09/27/2019  . 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 Rash 04/28/2020  . Mild persistent asthma  06/17/2020  . Allergic rhinitis with probable nonallergic component 06/17/2020  . Adverse food reaction 06/17/2020  . Bloating 06/19/2020  . Intertrigo 06/19/2020  . Hashimoto's thyroiditis 06/24/2020  . Medication side effect 08/03/2020  . Bilateral swelling of feet and ankles 09/15/2020  . Acute nonintractable headache 09/15/2020   Resolved Ambulatory Problems    Diagnosis Date Noted  . Acute pain of left shoulder 01/08/2019  . History of fusion of cervical spine 01/08/2019  . Neck pain 01/09/2019   Past Medical History:  Diagnosis Date  . DDD (degenerative disc disease), lumbar   . Depression      Review of Systems   see HPI>  Objective:   Physical Exam Vitals reviewed.  Constitutional:      Appearance: Normal appearance. She is obese.  Cardiovascular:     Rate and Rhythm: Normal rate and regular rhythm.     Pulses: Normal pulses.     Heart sounds: Normal heart sounds.  Pulmonary:     Effort: Pulmonary effort is normal.  Musculoskeletal:     Comments: Scant bilateral feet/ankle edema pitting.   Neurological:     General: No focal deficit present.     Mental Status: She is alert and oriented to person, place, and time.  Psychiatric:        Mood and Affect: Mood normal.           Assessment & Plan:  Marland KitchenMarland KitchenElina was seen today for leg swelling.  Diagnoses and all orders for this visit:  Bilateral swelling of feet and ankles -     COMPLETE METABOLIC PANEL WITH GFR -     CBC -     hydrochlorothiazide (HYDRODIURIL) 12.5 MG tablet; Take 1 tablet (12.5 mg total) by mouth daily. -     TSH -     T4, free -     T3, free -     POCT UA - Microalbumin  Acute nonintractable headache, unspecified headache type  Hypothyroidism, unspecified type -     TSH -     T4, free -     T3, free  Fibromyalgia  Elevated blood pressure reading -     hydrochlorothiazide (HYDRODIURIL) 12.5 MG tablet; Take 1 tablet (12.5 mg total) by mouth daily. -     POCT UA -  Microalbumin   BP elevated with scant edema.  No protein in urine.  Recheck TSH.  Check liver enzymes. Discussed compression stockings.  Added HCTZ daily.   Recheck BP and swelling in 2 weeks.

## 2020-09-08 NOTE — Patient Instructions (Addendum)
Get labs.  Start HCTZ daily.   Chronic Venous Insufficiency Chronic venous insufficiency is a condition where the leg veins cannot effectively pump blood from the legs to the heart. This happens when the vein walls are either stretched, weakened, or damaged, or when the valves inside the vein are damaged. With the right treatment, you should be able to continue with an active life. This condition is also called venous stasis. What are the causes? Common causes of this condition include:  High blood pressure inside the veins (venous hypertension).  Sitting or standing too long, causing increased blood pressure in the leg veins.  A blood clot that blocks blood flow in a vein (deep vein thrombosis, DVT).  Inflammation of a vein (phlebitis) that causes a blood clot to form.  Tumors in the pelvis that cause blood to back up. What increases the risk? The following factors may make you more likely to develop this condition:  Having a family history of this condition.  Obesity.  Pregnancy.  Living without enough regular physical activity or exercise (sedentary lifestyle).  Smoking.  Having a job that requires long periods of standing or sitting in one place.  Being a certain age. Women in their 72s and 45s and men in their 66s are more likely to develop this condition. What are the signs or symptoms? Symptoms of this condition include:  Veins that are enlarged, bulging, or twisted (varicose veins).  Skin breakdown or ulcers.  Reddened skin or dark discoloration of skin on the leg between the knee and ankle.  Brown, smooth, tight, and painful skin just above the ankle, usually on the inside of the leg (lipodermatosclerosis).  Swelling of the legs. How is this diagnosed? This condition may be diagnosed based on:  Your medical history.  A physical exam.  Tests, such as: ? A procedure that creates an image of a blood vessel and nearby organs and provides information about  blood flow through the blood vessel (duplex ultrasound). ? A procedure that tests blood flow (plethysmography). ? A procedure that looks at the veins using X-ray and dye (venogram). How is this treated? The goals of treatment are to help you return to an active life and to minimize pain or disability. Treatment depends on the severity of your condition, and it may include:  Wearing compression stockings. These can help relieve symptoms and help prevent your condition from getting worse. However, they do not cure the condition.  Sclerotherapy. This procedure involves an injection of a solution that shrinks damaged veins.  Surgery. This may involve: ? Removing a diseased vein (vein stripping). ? Cutting off blood flow through the vein (laser ablation surgery). ? Repairing or reconstructing a valve within the affected vein. Follow these instructions at home:      Wear compression stockings as told by your health care provider. These stockings help to prevent blood clots and reduce swelling in your legs.  Take over-the-counter and prescription medicines only as told by your health care provider.  Stay active by exercising, walking, or doing different activities. Ask your health care provider what activities are safe for you and how much exercise you need.  Drink enough fluid to keep your urine pale yellow.  Do not use any products that contain nicotine or tobacco, such as cigarettes, e-cigarettes, and chewing tobacco. If you need help quitting, ask your health care provider.  Keep all follow-up visits as told by your health care provider. This is important. Contact a health care provider if  you:  Have redness, swelling, or more pain in the affected area.  See a red streak or line that goes up or down from the affected area.  Have skin breakdown or skin loss in the affected area, even if the breakdown is small.  Get an injury in the affected area. Get help right away if:  You get  an injury and an open wound in the affected area.  You have: ? Severe pain that does not get better with medicine. ? Sudden numbness or weakness in the foot or ankle below the affected area. ? Trouble moving your foot or ankle. ? A fever. ? Worse or persistent symptoms. ? Chest pain. ? Shortness of breath. Summary  Chronic venous insufficiency is a condition where the leg veins cannot effectively pump blood from the legs to the heart.  Chronic venous insufficiency occurs when the vein walls become stretched, weakened, or damaged, or when valves within the vein are damaged.  Treatment depends on how severe your condition is. It often involves wearing compression stockings and may involve having a procedure.  Make sure you stay active by exercising, walking, or doing different activities. Ask your health care provider what activities are safe for you and how much exercise you need. This information is not intended to replace advice given to you by your health care provider. Make sure you discuss any questions you have with your health care provider. Document Revised: 08/14/2018 Document Reviewed: 08/14/2018 Elsevier Patient Education  2020 [employer].

## 2020-09-09 LAB — CBC
HCT: 38.8 % (ref 35.0–45.0)
Hemoglobin: 13 g/dL (ref 11.7–15.5)
MCH: 31 pg (ref 27.0–33.0)
MCHC: 33.5 g/dL (ref 32.0–36.0)
MCV: 92.4 fL (ref 80.0–100.0)
MPV: 10.5 fL (ref 7.5–12.5)
Platelets: 274 10*3/uL (ref 140–400)
RBC: 4.2 10*6/uL (ref 3.80–5.10)
RDW: 12.7 % (ref 11.0–15.0)
WBC: 7.5 10*3/uL (ref 3.8–10.8)

## 2020-09-09 LAB — COMPLETE METABOLIC PANEL WITH GFR
AG Ratio: 1.6 (calc) (ref 1.0–2.5)
ALT: 26 U/L (ref 6–29)
AST: 25 U/L (ref 10–35)
Albumin: 4.2 g/dL (ref 3.6–5.1)
Alkaline phosphatase (APISO): 100 U/L (ref 37–153)
BUN: 9 mg/dL (ref 7–25)
CO2: 27 mmol/L (ref 20–32)
Calcium: 9.2 mg/dL (ref 8.6–10.4)
Chloride: 107 mmol/L (ref 98–110)
Creat: 0.63 mg/dL (ref 0.50–1.05)
GFR, Est African American: 119 mL/min/{1.73_m2} (ref >=60)
GFR, Est Non African American: 102 mL/min/{1.73_m2} (ref >=60)
Globulin: 2.6 g/dL (calc) (ref 1.9–3.7)
Glucose, Bld: 98 mg/dL (ref 65–99)
Potassium: 4.2 mmol/L (ref 3.5–5.3)
Sodium: 141 mmol/L (ref 135–146)
Total Bilirubin: 0.2 mg/dL (ref 0.2–1.2)
Total Protein: 6.8 g/dL (ref 6.1–8.1)

## 2020-09-09 NOTE — Progress Notes (Signed)
Melinda Browning,   Your labs look really good.  Even your thyroid is in perfect range.  Your T3 is right in the middle. From these labs no indication you need to change your thyroid medication. If you wanted to try 5mg  of cytomel we could(levels right in the middle so would risk making them too high) and recheck labs in 6 weeks.

## 2020-09-15 DIAGNOSIS — M25475 Effusion, left foot: Secondary | ICD-10-CM | POA: Insufficient documentation

## 2020-09-15 DIAGNOSIS — R519 Headache, unspecified: Secondary | ICD-10-CM | POA: Insufficient documentation

## 2020-09-15 DIAGNOSIS — M25471 Effusion, right ankle: Secondary | ICD-10-CM | POA: Insufficient documentation

## 2020-09-23 ENCOUNTER — Ambulatory Visit (INDEPENDENT_AMBULATORY_CARE_PROVIDER_SITE_OTHER): Payer: BC Managed Care – PPO | Admitting: Physician Assistant

## 2020-09-23 VITALS — BP 124/61 | HR 92 | Ht 63.5 in | Wt 199.0 lb

## 2020-09-23 DIAGNOSIS — R300 Dysuria: Secondary | ICD-10-CM

## 2020-09-23 DIAGNOSIS — M797 Fibromyalgia: Secondary | ICD-10-CM | POA: Diagnosis not present

## 2020-09-23 DIAGNOSIS — M7702 Medial epicondylitis, left elbow: Secondary | ICD-10-CM

## 2020-09-23 LAB — POCT URINALYSIS DIP (CLINITEK)
Bilirubin, UA: NEGATIVE
Blood, UA: NEGATIVE
Glucose, UA: NEGATIVE mg/dL
Nitrite, UA: NEGATIVE
POC PROTEIN,UA: NEGATIVE
Spec Grav, UA: 1.02 (ref 1.010–1.025)
Urobilinogen, UA: 0.2 E.U./dL
pH, UA: 7 (ref 5.0–8.0)

## 2020-09-23 MED ORDER — TRAMADOL HCL 50 MG PO TABS: ORAL_TABLET | ORAL | 0 refills | Status: DC

## 2020-09-23 NOTE — Progress Notes (Signed)
Subjective:    Patient ID: Melinda Browning, female    DOB: Jul 03, 1966, 54 y.o.   MRN: [id number]  HPI  Pt is a 54 yo female with fibromyalgia, asthma, GERD, hypothyroidism who presents to the clinic to follow up after UTI.   Pt would like to make sure all infection is cleared. She was having dysuria but it has cleared. No fever, chills, nausea.  Pt continues to complain of "pain everywhere". She aches all over. Her job makes it worse. She is on cymbalta and gabapentin. Her medial left elbow is hurting the most right now. Not done anything to make better. She has noticed some swelling medially as well.    .. Active Ambulatory Problems    Diagnosis Date Noted  . GERD (gastroesophageal reflux disease) 03/22/2018  . Hypothyroidism 03/22/2018  . Fibromyalgia 03/22/2018  . Depression, major, single episode, complete remission (HCC) 03/22/2018  . Class 1 obesity due to excess calories with serious comorbidity and body mass index (BMI) of 34.0 to 34.9 in adult 03/22/2018  . No energy 03/25/2018  . Thyroid nodule 08/03/2018  . Vitamin D deficiency 08/06/2018  . History of C5-C6 cervical ACDF 01/08/2019  . Lumbar degenerative disc disease 01/17/2019  . SOB (shortness of breath) 07/02/2019  . Cough 07/02/2019  . Abnormal computed tomography angiography (CTA) 07/02/2019  . Low serum vitamin B12 08/27/2019  . Pain in joint involving multiple sites 08/27/2019  . Elevated blood pressure reading 09/27/2019  . B12 deficiency 09/27/2019  . 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 Rash 04/28/2020  . Mild persistent asthma 06/17/2020  . Allergic rhinitis with probable nonallergic component 06/17/2020  . Adverse food reaction 06/17/2020  . Bloating 06/19/2020  . Intertrigo 06/19/2020  . Hashimoto's thyroiditis 06/24/2020  . Medication side effect 08/03/2020  . Bilateral swelling of feet and ankles 09/15/2020  . Acute nonintractable headache 09/15/2020   Resolved Ambulatory Problems    Diagnosis Date  Noted  . Acute pain of left shoulder 01/08/2019  . History of fusion of cervical spine 01/08/2019  . Neck pain 01/09/2019   Past Medical History:  Diagnosis Date  . DDD (degenerative disc disease), lumbar   . Depression     Review of Systems  All other systems reviewed and are negative.      Objective:   Physical Exam Vitals reviewed.  Constitutional:      Appearance: Normal appearance. She is obese.  Cardiovascular:     Rate and Rhythm: Normal rate and regular rhythm.     Pulses: Normal pulses.  Pulmonary:     Effort: Pulmonary effort is normal.  Abdominal:     General: Bowel sounds are normal. There is no distension.     Palpations: Abdomen is soft.     Tenderness: There is no right CVA tenderness or left CVA tenderness.  Musculoskeletal:     Comments: Tenderness over medial left epicondyle.   Neurological:     General: No focal deficit present.     Mental Status: She is alert and oriented to person, place, and time.  Psychiatric:        Mood and Affect: Mood normal.       .. Results for orders placed or performed in visit on 09/23/20  Urine Culture   Specimen: Urine  Result Value Ref Range   MICRO NUMBER: [id number]    SPECIMEN QUALITY: Adequate    Sample Source NOT GIVEN    STATUS: FINAL    ISOLATE 1:      Growth  of mixed flora was isolated, suggesting probable contamination. No further testing will be performed. If clinically indicated, recollection using a method to minimize contamination, with prompt transfer to Urine Culture Transport Tube, is  recommended.   POCT URINALYSIS DIP (CLINITEK)  Result Value Ref Range   Color, UA yellow yellow   Clarity, UA clear clear   Glucose, UA negative negative mg/dL   Bilirubin, UA negative negative   Ketones, POC UA small (15) (A) negative mg/dL   Spec Grav, UA 4.098 1.191 - 1.025   Blood, UA negative negative   pH, UA 7.0 5.0 - 8.0   POC PROTEIN,UA negative negative, trace   Urobilinogen, UA 0.2 0.2 or 1.0  E.U./dL   Nitrite, UA Negative Negative   Leukocytes, UA Trace (A) Negative       Assessment & Plan:  Marland KitchenMarland KitchenDarielys was seen today for follow-up and dysuria.  Diagnoses and all orders for this visit:  Fibromyalgia -     traMADol (ULTRAM) 50 MG tablet; Take one tablet up to three times a day as needed.  Dysuria -     POCT URINALYSIS DIP (CLINITEK) -     Urine Culture -     BASIC METABOLIC PANEL WITH GFR  Golfers elbow of left upper extremity -     traMADol (ULTRAM) 50 MG tablet; Take one tablet up to three times a day as needed.   UA dipstick positive for ketones and leukocytes. Sent for culture and bmp.   Seems to be in fibromyalgia flare. Added tramadol as needed.  Marland KitchenMarland KitchenPDMP reviewed during this encounter.   Tramadol for epicondylitis as well. Placed in brace. Use diclofenac cream and NSAIds as needed. Ice area. Exercises given. Follow up in 2-4 weeks. HO given.

## 2020-09-23 NOTE — Patient Instructions (Addendum)
Myofascial Pain Syndrome and Fibromyalgia Myofascial pain syndrome and fibromyalgia are both pain disorders. This pain may be felt mainly in your muscles.  Myofascial pain syndrome: ? Always has tender points in the muscle that will cause pain when pressed (trigger points). The pain may come and go. ? Usually affects your neck, upper back, and shoulder areas. The pain often radiates into your arms and hands.  Fibromyalgia: ? Has muscle pains and tenderness that come and go. ? Is often associated with fatigue and sleep problems. ? Has trigger points. ? Tends to be long-lasting (chronic), but is not life-threatening. Fibromyalgia and myofascial pain syndrome are not the same. However, they often occur together. If you have both conditions, each can make the other worse. Both are common and can cause enough pain and fatigue to make day-to-day activities difficult. Both can be hard to diagnose because their symptoms are common in many other conditions. What are the causes? The exact causes of these conditions are not known. What increases the risk? You are more likely to develop this condition if:  You have a family history of the condition.  You have certain triggers, such as: ? Spine disorders. ? An injury (trauma) or other physical stressors. ? Being under a lot of stress. ? Medical conditions such as osteoarthritis, rheumatoid arthritis, or lupus. What are the signs or symptoms? Fibromyalgia The main symptom of fibromyalgia is widespread pain and tenderness in your muscles. Pain is sometimes described as stabbing, shooting, or burning. You may also have:  Tingling or numbness.  Sleep problems and fatigue.  Problems with attention and concentration (fibro fog). Other symptoms may include:  Bowel and bladder problems.  Headaches.  Visual problems.  Problems with odors and noises.  Depression or mood changes.  Painful menstrual periods (dysmenorrhea).  Dry skin or  eyes. These symptoms can vary over time. Myofascial pain syndrome Symptoms of myofascial pain syndrome include:  Tight, ropy bands of muscle.  Uncomfortable sensations in muscle areas. These may include aching, cramping, burning, numbness, tingling, and weakness.  Difficulty moving certain parts of the body freely (poor range of motion). How is this diagnosed? This condition may be diagnosed by your symptoms and medical history. You will also have a physical exam. In general:  Fibromyalgia is diagnosed if you have pain, fatigue, and other symptoms for more than 3 months, and symptoms cannot be explained by another condition.  Myofascial pain syndrome is diagnosed if you have trigger points in your muscles, and those trigger points are tender and cause pain elsewhere in your body (referred pain). How is this treated? Treatment for these conditions depends on the type that you have.  For fibromyalgia: ? Pain medicines, such as NSAIDs. ? Medicines for treating depression. ? Medicines for treating seizures. ? Medicines that relax the muscles.  For myofascial pain: ? Pain medicines, such as NSAIDs. ? Cooling and stretching of muscles. ? Trigger point injections. ? Sound wave (ultrasound) treatments to stimulate muscles. Treating these conditions often requires a team of health care providers. These may include:  Your primary care provider.  Physical therapist.  Complementary health care providers, such as massage therapists or acupuncturists.  Psychiatrist for cognitive behavioral therapy. Follow these instructions at home: Medicines  Take over-the-counter and prescription medicines only as told by your health care provider.  Do not drive or use heavy machinery while taking prescription pain medicine.  If you are taking prescription pain medicine, take actions to prevent or treat constipation. Your health care  provider may recommend that you: ? Drink enough fluid to keep  your urine pale yellow. ? Eat foods that are high in fiber, such as fresh fruits and vegetables, whole grains, and beans. ? Limit foods that are high in fat and processed sugars, such as fried or sweet foods. ? Take an over-the-counter or prescription medicine for constipation. Lifestyle   Exercise as directed by your health care provider or physical therapist.  Practice relaxation techniques to control your stress. You may want to try: ? Biofeedback. ? Visual imagery. ? Hypnosis. ? Muscle relaxation. ? Yoga. ? Meditation.  Maintain a healthy lifestyle. This includes eating a healthy diet and getting enough sleep.  Do not use any products that contain nicotine or tobacco, such as cigarettes and e-cigarettes. If you need help quitting, ask your health care provider. General instructions  Talk to your health care provider about complementary treatments, such as acupuncture or massage.  Consider joining a support group with others who are diagnosed with this condition.  Do not do activities that stress or strain your muscles. This includes repetitive motions and heavy lifting.  Keep all follow-up visits as told by your health care provider. This is important. Where to find more information  National Fibromyalgia Association: www.fmaware.org  Arthritis Foundation: www.arthritis.org  American Chronic Pain Association: www.theacpa.org Contact a health care provider if:  You have new symptoms.  Your symptoms get worse or your pain is severe.  You have side effects from your medicines.  You have trouble sleeping.  Your condition is causing depression or anxiety. Summary  Myofascial pain syndrome and fibromyalgia are pain disorders.  Myofascial pain syndrome has tender points in the muscle that will cause pain when pressed (trigger points). Fibromyalgia also has muscle pains and tenderness that come and go, but this condition is often associated with fatigue and sleep  disturbances.  Fibromyalgia and myofascial pain syndrome are not the same but often occur together, causing pain and fatigue that make day-to-day activities difficult.  Treatment for fibromyalgia includes taking medicines to relax the muscles and medicines for pain, depression, or seizures. Treatment for myofascial pain syndrome includes taking medicines for pain, cooling and stretching of muscles, and injecting medicines into trigger points.  Follow your health care provider's instructions for taking medicines and maintaining a healthy lifestyle. This information is not intended to replace advice given to you by your health care provider. Make sure you discuss any questions you have with your health care provider. Document Revised: 03/15/2019 Document Reviewed: 12/06/2017 Elsevier Patient Education  2020 Elsevier Inc. Golfer's Elbow  Golfer's elbow, also called medial epicondylitis, is a condition that results from inflammation of the strong bands of tissue (tendons) that attach your forearm muscles to the inside of your bone at the elbow. These tendons affect the muscles that bend the palm toward the wrist (flexion). The tendons become less flexible with age. This condition is called golfer's elbow because it is more common among people who constantly bend and twist their wrists, such as golfers. This injury is usually caused by overuse. What are the causes? This condition is caused by:  Repeatedly flexing, turning, or twisting your wrist.  Constantly gripping objects with your hands. What increases the risk? This condition is more likely to develop in people who play golf, baseball, or tennis. This injury is more common among people who have jobs that require the constant use of their hands, such as:  Carpenters.  Butchers.  Musicians.  Typists. What are the signs  or symptoms? This condition causes elbow pain that may spread to your forearm and upper arm. Symptoms of this condition  include.  Pain at the inner elbow, forearm, or wrist.  Reduced grip strength. The pain may get worse when you bend your wrist downward. How is this diagnosed? This condition is diagnosed based on your symptoms, medical history, and a physical exam. During the exam, your health care provider may:  Test your grip strength.  Move your wrist to check for pain. You may also have an MRI to:  Confirm the diagnosis.  Look for other issues.  Check for tears in the ligaments, muscles, or tendons. How is this treated? Treatment for this condition includes:  Stopping all activities that make you bend or twist your elbow or wrist and waiting until your pain and other symptoms go away before resuming those activities.  Wearing an elbow brace or wrist splint to restrict the movements that cause symptoms.  Icing your inner elbow, forearm, or wrist to relieve pain.  Taking NSAIDs or getting corticosteroid injections to reduce pain and swelling.  Doing stretching, range-of-motion, and strengthening exercises (physical therapy) as told by your health care provider. In rare cases, surgery may be needed if your condition does not improve. Follow these instructions at home: If you have a brace or splint:  Wear it as told by your health care provider.  Loosen it if your fingers tingle, become numb, or turn cold and blue.  Keep it clean. Managing pain, stiffness, and swelling   If directed, put ice on the injured area. ? Put ice in a plastic bag. ? Place a towel between your skin and the bag. ? Leave the ice on for 20 minutes, 2-3 times a day.  Move your fingers often to avoid stiffness.  Raise (elevate) the injured area above the level of your heart while you are sitting or lying down. Activity  Rest your injured area as told by your health care provider.  Return to your normal activities as told by your health care provider. Ask your health care provider what activities are safe for  you.  Do exercises as told by your health care provider. Lifestyle  If your condition is caused by sports, work with a trainer to make sure that you: ? Have the correct technique. ? Are using the proper equipment.  If your condition is work related, talk with your employer about changes that can be made. General instructions  Take over-the-counter and prescription medicines only as told by your health care provider.  Do not use any products that contain nicotine or tobacco, such as cigarettes, e-cigarettes, and chewing tobacco. If you need help quitting, ask your health care provider.  Keep all follow-up visits as told by your health care provider. This is important. How is this prevented?  Before and after activity: ? Warm up and stretch before being active. ? Cool down and stretch after being active. ? Give your body time to rest between periods of activity.  During activity: ? Make sure to use equipment that fits you. ? If you play golf, slow your golf swing to reduce shock in the arm when making contact with the ball.  Maintain physical fitness, including: ? Strength. ? Flexibility. ? Cardiovascular fitness. ? Endurance.  Do exercises to strengthen the forearm muscles. Contact a health care provider if:  Your pain does not improve or it gets worse.  You notice numbness in your hand. Get help right away if:  Your  pain is severe.  You cannot move your wrist. Summary  Golfer's elbow, also called medial epicondylitis, is a condition that results from inflammation of the strong bands of tissue (tendons) that attach your forearm muscles to the inside of your bone at the elbow.  This injury usually results from overuse.  Symptoms of this condition include decreased grip strength and pain at the inner elbow, forearm, or wrist.  This injury is treated with rest, ice, medicines, physical therapy, and surgery as needed. This information is not intended to replace advice  given to you by your health care provider. Make sure you discuss any questions you have with your health care provider. Document Revised: 03/14/2019 Document Reviewed: 09/27/2018 Elsevier Patient Education  2020 [employer].

## 2020-09-24 LAB — URINE CULTURE
MICRO NUMBER:: 11097217
SPECIMEN QUALITY:: ADEQUATE

## 2020-09-25 ENCOUNTER — Encounter: Payer: Self-pay | Admitting: Physician Assistant

## 2020-09-25 NOTE — Progress Notes (Signed)
Melinda Browning,   No urinary tract infection.

## 2020-09-28 ENCOUNTER — Encounter: Payer: Self-pay | Admitting: Physician Assistant

## 2020-10-08 ENCOUNTER — Encounter: Payer: Self-pay | Admitting: Physician Assistant

## 2020-10-21 ENCOUNTER — Other Ambulatory Visit: Payer: Self-pay

## 2020-10-21 ENCOUNTER — Ambulatory Visit (INDEPENDENT_AMBULATORY_CARE_PROVIDER_SITE_OTHER): Payer: BC Managed Care – PPO | Admitting: Physician Assistant

## 2020-10-21 VITALS — BP 138/77 | HR 87 | Ht 63.5 in | Wt 198.0 lb

## 2020-10-21 DIAGNOSIS — R202 Paresthesia of skin: Secondary | ICD-10-CM

## 2020-10-21 DIAGNOSIS — R200 Anesthesia of skin: Secondary | ICD-10-CM | POA: Diagnosis not present

## 2020-10-21 DIAGNOSIS — M7702 Medial epicondylitis, left elbow: Secondary | ICD-10-CM | POA: Diagnosis not present

## 2020-10-21 MED ORDER — DICLOFENAC SODIUM 75 MG PO TBEC: 75.0000 mg | DELAYED_RELEASE_TABLET | Freq: Two times a day (BID) | ORAL | 1 refills | Status: DC

## 2020-10-21 MED ORDER — AMITRIPTYLINE HCL 50 MG PO TABS: 50.0000 mg | ORAL_TABLET | Freq: Every day | ORAL | 1 refills | Status: DC

## 2020-10-21 NOTE — Progress Notes (Signed)
Subjective:    Patient ID: Melinda Browning, female    DOB: [birth year], 54 y.o.   MRN: [id number]  HPI  Patient is a 54 year old female with fibromyalgia degenerative chest disease and history of ACDF at C5-C6 who presents to the clinic with any problem over the last 4 weeks. She was following up on left elbow pain. It has resolved.   She is having more more numbness and tingling of her right and left hand.  At times it does radiate up into the arm into her shoulder.  She denies any known injury.  Her right is much worse than her left.  She is having numbness and tingling in her index and middle fingers. She is taking cymbalta, tramadol, gabpentin. She has had cervical epidural injections before. No injury. Continues to have trouble sleeping with staying a sleep. Numbness is worse in mornings.  .. Active Ambulatory Problems    Diagnosis Date Noted  . GERD (gastroesophageal reflux disease) 03/22/2018  . Hypothyroidism 03/22/2018  . Fibromyalgia 03/22/2018  . Depression, major, single episode, complete remission (HCC) 03/22/2018  . Class 1 obesity due to excess calories with serious comorbidity and body mass index (BMI) of 34.0 to 34.9 in adult 03/22/2018  . No energy 03/25/2018  . Thyroid nodule 08/03/2018  . Vitamin D deficiency 08/06/2018  . History of C5-C6 cervical ACDF 01/08/2019  . Lumbar degenerative disc disease 01/17/2019  . SOB (shortness of breath) 07/02/2019  . Cough 07/02/2019  . Abnormal computed tomography angiography (CTA) 07/02/2019  . Low serum vitamin B12 08/27/2019  . Pain in joint involving multiple sites 08/27/2019  . Elevated blood pressure reading 09/27/2019  . B12 deficiency 09/27/2019  . 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 Rash 04/28/2020  . Mild persistent asthma 06/17/2020  . Allergic rhinitis with probable nonallergic component 06/17/2020  . Adverse food reaction 06/17/2020  . Bloating 06/19/2020  . Intertrigo 06/19/2020  . Hashimoto's thyroiditis 06/24/2020   . Medication side effect 08/03/2020  . Bilateral swelling of feet and ankles 09/15/2020  . Acute nonintractable headache 09/15/2020  . Numbness and tingling of left hand 10/23/2020  . Numbness and tingling of right hand 10/23/2020  . Golfers elbow of left upper extremity 10/23/2020   Resolved Ambulatory Problems    Diagnosis Date Noted  . Acute pain of left shoulder 01/08/2019  . History of fusion of cervical spine 01/08/2019  . Neck pain 01/09/2019   Past Medical History:  Diagnosis Date  . DDD (degenerative disc disease), lumbar   . Depression       Review of Systems See HPI.     Objective:   Physical Exam Vitals reviewed.  Constitutional:      Appearance: Normal appearance.  HENT:     Head: Normocephalic.  Cardiovascular:     Rate and Rhythm: Normal rate and regular rhythm.     Pulses: Normal pulses.     Heart sounds: Normal heart sounds.  Pulmonary:     Effort: Pulmonary effort is normal.     Breath sounds: Normal breath sounds.  Musculoskeletal:     Comments: Neck is stiff.  Normal upper ext strength 5/5.  Hand grip decreased right 4/5.  Positive tinels on right and phalens on right.   Neurological:     General: No focal deficit present.     Mental Status: She is alert and oriented to person, place, and time.  Psychiatric:        Mood and Affect: Mood normal.  Assessment & Plan:  Marland KitchenMarland KitchenGeet was seen today for follow-up.  Diagnoses and all orders for this visit:  Numbness and tingling of right hand -     amitriptyline (ELAVIL) 50 MG tablet; Take 1 tablet (50 mg total) by mouth at bedtime. -     diclofenac (VOLTAREN) 75 MG EC tablet; Take 1 tablet (75 mg total) by mouth 2 (two) times daily. -     NCV with EMG(electromyography)  Numbness and tingling of left hand -     amitriptyline (ELAVIL) 50 MG tablet; Take 1 tablet (50 mg total) by mouth at bedtime. -     diclofenac (VOLTAREN) 75 MG EC tablet; Take 1 tablet (75 mg total) by mouth 2  (two) times daily. -     NCV with EMG(electromyography)  Golfers elbow of left upper extremity   Elbow improved. Explained that it could rom time to time flare.   EMG to tease out cervical radiculopathy vs carpal tunnel vs neuropathy.  Start elavil and voltaren.  Elavil should help with sleep.  Not on NSAID. Daily NSAID could help with overall inflammation.  Consider follow up with sports medicine.   Follow up after EMG.

## 2020-10-21 NOTE — Progress Notes (Signed)
Elbow is a little bit better Having issues with hand numbness R>L Trouble sleeping

## 2020-10-21 NOTE — Patient Instructions (Addendum)
Will schedule EMG.  Start amitripyline at bedtime.  Start diclofenac twice a day.

## 2020-10-23 DIAGNOSIS — R200 Anesthesia of skin: Secondary | ICD-10-CM | POA: Insufficient documentation

## 2020-10-23 DIAGNOSIS — M7702 Medial epicondylitis, left elbow: Secondary | ICD-10-CM | POA: Insufficient documentation

## 2020-10-28 ENCOUNTER — Other Ambulatory Visit: Payer: Self-pay

## 2020-10-28 ENCOUNTER — Ambulatory Visit (INDEPENDENT_AMBULATORY_CARE_PROVIDER_SITE_OTHER): Payer: BC Managed Care – PPO | Admitting: Sports Medicine

## 2020-10-28 ENCOUNTER — Telehealth: Payer: Self-pay | Admitting: Physician Assistant

## 2020-10-28 DIAGNOSIS — G5603 Carpal tunnel syndrome, bilateral upper limbs: Secondary | ICD-10-CM | POA: Diagnosis not present

## 2020-10-28 NOTE — Progress Notes (Signed)
    Procedures performed today:    None.  Independent interpretation of notes and tests performed by another provider:   None.  Brief History, Exam, Impression, and Recommendations:    Carpal tunnel syndrome on both sides For the past couple of weeks this pleasant 54 year old female has had numbness and tingling in both hands, first through fourth fingers, worse at night. On exam she has positive Tinel's and Phalen signs bilaterally without thenar atrophy. This is classic carpal tunnel syndrome, continue medications prescribed by PCP, I am going to add bilateral nighttime splinting, home rehab exercises, she can return to see me in a month, bilateral median nerve hydrodissection is of no better. A nerve conduction study was ordered so this will also be helpful with diagnostics.    ___________________________________________ Ihor Austin. Benjamin Stain, M.D., ABFM., CAQSM. Primary Care and Sports Medicine [facility]  Adjunct Instructor of Family Medicine  [employer]

## 2020-10-28 NOTE — Telephone Encounter (Signed)
Patient was in office for an appt with Dr T later this afternoon, 10/28/2020, and had paperwork to give PCP to fill out. Paperwork placed in provider box. AM

## 2020-10-28 NOTE — Assessment & Plan Note (Signed)
For the past couple of weeks this pleasant 54 year old female has had numbness and tingling in both hands, first through fourth fingers, worse at night. On exam she has positive Tinel's and Phalen signs bilaterally without thenar atrophy. This is classic carpal tunnel syndrome, continue medications prescribed by PCP, I am going to add bilateral nighttime splinting, home rehab exercises, she can return to see me in a month, bilateral median nerve hydrodissection is of no better. A nerve conduction study was ordered so this will also be helpful with diagnostics.

## 2020-11-02 NOTE — Telephone Encounter (Signed)
Paperwork completed. Placed at front desk to collect payment and make patient aware.

## 2020-11-02 NOTE — Telephone Encounter (Signed)
When done will fax and let patient know.

## 2020-11-14 ENCOUNTER — Other Ambulatory Visit: Payer: Self-pay | Admitting: Physician Assistant

## 2020-11-14 DIAGNOSIS — M797 Fibromyalgia: Secondary | ICD-10-CM

## 2020-11-25 ENCOUNTER — Ambulatory Visit (INDEPENDENT_AMBULATORY_CARE_PROVIDER_SITE_OTHER): Payer: BC Managed Care – PPO | Admitting: Sports Medicine

## 2020-11-25 ENCOUNTER — Ambulatory Visit (INDEPENDENT_AMBULATORY_CARE_PROVIDER_SITE_OTHER): Payer: BC Managed Care – PPO

## 2020-11-25 DIAGNOSIS — G5603 Carpal tunnel syndrome, bilateral upper limbs: Secondary | ICD-10-CM | POA: Diagnosis not present

## 2020-11-25 NOTE — Assessment & Plan Note (Signed)
Unfortunately this pleasant 54 year old female failed conservative treatment with nighttime splinting, at the last visit she did have a positive Tinel's, Phalen signs without thenar atrophy. Because of carpal tunnel syndrome, nerve conduction study is coming up. Due to failure of treatment we proceeded with bilateral median nerve Hydro dissections today, return to see me in a month, her median nerves were enlarged bilaterally.

## 2020-11-25 NOTE — Progress Notes (Signed)
    Procedures performed today:    Procedure: Real-time Ultrasound Guided hydrodissection of the left median nerve at the carpal tunnel Device: Samsung HS60 Verbal informed consent obtained.  Time-out conducted.  Noted no overlying erythema, induration, or other signs of local infection.  Skin prepped in a sterile fashion.  Local anesthesia: Topical Ethyl chloride.  With sterile technique and under real time ultrasound guidance: Using a 25-gauge needle advanced into the carpal tunnel, taking care to avoid intraneural injection I injected medication both superficial to and deep to the median nerve freeing it from surrounding structures, I then redirected the needle deep and injected further medication around the flexor tendons deep within the carpal tunnel for a total of 1 cc kenalog 40, 5 cc 1% lidocaine without epinephrine. Completed without difficulty  Advised to call if fevers/chills, erythema, induration, drainage, or persistent bleeding.  Images permanently stored and available for review in PACS.  Impression: Technically successful ultrasound guided median nerve hydrodissection.  Procedure: Real-time Ultrasound Guided hydrodissection of the right median nerve at the carpal tunnel Device: Samsung HS60 Verbal informed consent obtained.  Time-out conducted.  Noted no overlying erythema, induration, or other signs of local infection.  Skin prepped in a sterile fashion.  Local anesthesia: Topical Ethyl chloride.  With sterile technique and under real time ultrasound guidance: Using a 25-gauge needle advanced into the carpal tunnel, taking care to avoid intraneural injection I injected medication both superficial to and deep to the median nerve freeing it from surrounding structures, I then redirected the needle deep and injected further medication around the flexor tendons deep within the carpal tunnel for a total of 1 cc kenalog 40, 5 cc 1% lidocaine without epinephrine. Completed without  difficulty  Advised to call if fevers/chills, erythema, induration, drainage, or persistent bleeding.  Images permanently stored and available for review in PACS.  Impression: Technically successful ultrasound guided median nerve hydrodissection.  Independent interpretation of notes and tests performed by another provider:   None.  Brief History, Exam, Impression, and Recommendations:    Carpal tunnel syndrome on both sides Unfortunately this pleasant 54 year old female failed conservative treatment with nighttime splinting, at the last visit she did have a positive Tinel's, Phalen signs without thenar atrophy. Because of carpal tunnel syndrome, nerve conduction study is coming up. Due to failure of treatment we proceeded with bilateral median nerve Hydro dissections today, return to see me in a month, her median nerves were enlarged bilaterally.    ___________________________________________ Ihor Austin. Benjamin Stain, M.D., ABFM., CAQSM. Primary Care and Sports Medicine [facility]  Adjunct Instructor of Family Medicine  [employer]

## 2020-12-07 ENCOUNTER — Encounter: Payer: BC Managed Care – PPO | Admitting: Neurology

## 2020-12-23 ENCOUNTER — Encounter: Payer: BC Managed Care – PPO | Admitting: Neurology

## 2020-12-23 ENCOUNTER — Ambulatory Visit: Payer: BC Managed Care – PPO | Admitting: Sports Medicine

## 2020-12-30 ENCOUNTER — Ambulatory Visit: Payer: BC Managed Care – PPO | Admitting: Sports Medicine

## 2021-01-05 ENCOUNTER — Other Ambulatory Visit: Payer: Self-pay | Admitting: Physician Assistant

## 2021-01-05 ENCOUNTER — Ambulatory Visit (INDEPENDENT_AMBULATORY_CARE_PROVIDER_SITE_OTHER): Payer: BC Managed Care – PPO | Admitting: Neurology

## 2021-01-05 ENCOUNTER — Encounter: Payer: Self-pay | Admitting: Neurology

## 2021-01-05 ENCOUNTER — Ambulatory Visit: Payer: BC Managed Care – PPO | Admitting: Neurology

## 2021-01-05 ENCOUNTER — Other Ambulatory Visit: Payer: Self-pay

## 2021-01-05 DIAGNOSIS — G5603 Carpal tunnel syndrome, bilateral upper limbs: Secondary | ICD-10-CM

## 2021-01-05 DIAGNOSIS — E039 Hypothyroidism, unspecified: Secondary | ICD-10-CM

## 2021-01-05 NOTE — Progress Notes (Signed)
Mild bilateral carpal tunnel. Consider surgery referral if having significant discomfort despite conservative treatment.

## 2021-01-05 NOTE — Procedures (Signed)
     HISTORY:  Melinda Browning is a 55 year old patient with a 61-month history of numbness in both hands.  The patient indicates that she has had cervical spine surgery done 15 years ago.  The patient is being evaluated for a possible neuropathy or a cervical radiculopathy.  NERVE CONDUCTION STUDIES:  Nerve conduction studies were performed on both upper extremities.  The distal motor latencies for the median nerves were prolonged bilaterally with normal motor amplitudes for these nerves bilaterally.  The distal motor latency and motor amplitudes for the right ulnar nerve were normal.  Nerve conduction velocities for the median nerves bilaterally and for the right ulnar nerve were normal.  The sensory latencies for the median nerves were prolonged bilaterally, normal for the ulnar nerve on the right.  The right ulnar F-wave latency was normal.  EMG STUDIES:  EMG study was performed on the right upper extremity:  The first dorsal interosseous muscle reveals 2 to 4 K units with full recruitment. No fibrillations or positive waves were noted. The abductor pollicis brevis muscle reveals 2 to 4 K units with full recruitment. No fibrillations or positive waves were noted. The extensor indicis proprius muscle reveals 1 to 3 K units with full recruitment. No fibrillations or positive waves were noted. The pronator teres muscle reveals 2 to 3 K units with full recruitment. No fibrillations or positive waves were noted. The biceps muscle reveals 1 to 2 K units with full recruitment. No fibrillations or positive waves were noted. The triceps muscle reveals 2 to 4 K units with full recruitment. No fibrillations or positive waves were noted. The anterior deltoid muscle reveals 2 to 3 K units with full recruitment. No fibrillations or positive waves were noted. The cervical paraspinal muscles were tested at 2 levels. No abnormalities of insertional activity were seen at either level tested. There was good  relaxation.   IMPRESSION:  Nerve conduction studies done on both upper extremities reveals evidence of mild bilateral carpal tunnel syndrome.  EMG evaluation of the right upper extremity was unremarkable, no evidence of an overlying cervical radiculopathy was seen.  Marlan Palau MD 01/05/2021 2:26 PM  [facility] [address] [place], [place] [address]  Phone [phone] Fax [phone]

## 2021-01-05 NOTE — Progress Notes (Signed)
Please refer to EMG and nerve conduction procedure note.  

## 2021-01-05 NOTE — Progress Notes (Signed)
[facility]    Nerve / Sites Muscle Latency Ref. Amplitude Ref. Rel Amp Segments Distance Velocity Ref. Area    ms ms mV mV %  cm m/s m/s mVms  R Median - APB     Wrist APB 5.9 ?4.4 5.7 ?4.0 100 Wrist - APB 7   19.5     Upper arm APB 9.7  5.4  95 Upper arm - Wrist 21 56 ?49 20.6  L Median - APB     Wrist APB 4.8 ?4.4 8.8 ?4.0 100 Wrist - APB 7   30.7     Upper arm APB 8.3  8.6  97.7 Upper arm - Wrist 19 55 ?49 29.9  R Ulnar - ADM     Wrist ADM 2.3 ?3.3 11.7 ?6.0 100 Wrist - ADM 7   30.7     B.Elbow ADM 5.1  8.4  71.6 B.Elbow - Wrist 18 64 ?49 26.1     A.Elbow ADM 6.7  8.1  96.7 A.Elbow - B.Elbow 10 64 ?49 25.7           SNC    Nerve / Sites Rec. Site Peak Lat Ref.  Amp Ref. Segments Distance    ms ms V V  cm  R Median - Orthodromic (Dig II, Mid palm)     Dig II Wrist 4.1 ?3.4 11 ?10 Dig II - Wrist 13  L Median - Orthodromic (Dig II, Mid palm)     Dig II Wrist 3.9 ?3.4 10 ?10 Dig II - Wrist 13  R Ulnar - Orthodromic, (Dig V, Mid palm)     Dig V Wrist 2.6 ?3.1 11 ?5 Dig V - Wrist 72           F  Wave    Nerve F Lat Ref.   ms ms  R Ulnar - ADM 24.2 ?32.0

## 2021-01-06 ENCOUNTER — Ambulatory Visit (INDEPENDENT_AMBULATORY_CARE_PROVIDER_SITE_OTHER): Payer: BC Managed Care – PPO | Admitting: Sports Medicine

## 2021-01-06 ENCOUNTER — Other Ambulatory Visit: Payer: Self-pay

## 2021-01-06 DIAGNOSIS — G5603 Carpal tunnel syndrome, bilateral upper limbs: Secondary | ICD-10-CM | POA: Diagnosis not present

## 2021-01-06 NOTE — Assessment & Plan Note (Signed)
This is a very pleasant 55 year old female, she has bilateral hand and finger numbness and tingling with a positive Tinel's and Phalen signs without thenar atrophy. She failed over a month of conservative treatment including nighttime splinting, I did a bilateral median nerve Hydro dissection with ultrasound guidance at the last visit, she only had about a month of relief and now has recurrence of symptoms. We also set her up with nerve conduction and EMG which confirmed bilateral median neuropathy across the wrist. Due to failure of conservative treatment I would like her to now proceed with a surgical consult with Dr. Amanda Pea.

## 2021-01-06 NOTE — Progress Notes (Signed)
    Procedures performed today:    None.  Independent interpretation of notes and tests performed by another provider:   None.  Brief History, Exam, Impression, and Recommendations:    Bilateral carpal tunnel syndrome This is a very pleasant 55 year old female, she has bilateral hand and finger numbness and tingling with a positive Tinel's and Phalen signs without thenar atrophy. She failed over a month of conservative treatment including nighttime splinting, I did a bilateral median nerve Hydro dissection with ultrasound guidance at the last visit, she only had about a month of relief and now has recurrence of symptoms. We also set her up with nerve conduction and EMG which confirmed bilateral median neuropathy across the wrist. Due to failure of conservative treatment I would like her to now proceed with a surgical consult with Dr. Amanda Pea.    ___________________________________________ Ihor Austin. Benjamin Stain, M.D., ABFM., CAQSM. Primary Care and Sports Medicine [facility]  Adjunct Instructor of Family Medicine  [employer]

## 2021-02-08 ENCOUNTER — Other Ambulatory Visit: Payer: Self-pay | Admitting: Physician Assistant

## 2021-02-08 DIAGNOSIS — E039 Hypothyroidism, unspecified: Secondary | ICD-10-CM

## 2021-02-19 ENCOUNTER — Other Ambulatory Visit: Payer: Self-pay

## 2021-02-19 ENCOUNTER — Ambulatory Visit (INDEPENDENT_AMBULATORY_CARE_PROVIDER_SITE_OTHER): Payer: BC Managed Care – PPO | Admitting: Physician Assistant

## 2021-02-19 VITALS — BP 134/72 | HR 92 | Ht 63.5 in | Wt 200.0 lb

## 2021-02-19 DIAGNOSIS — M5136 Other intervertebral disc degeneration, lumbar region: Secondary | ICD-10-CM

## 2021-02-19 DIAGNOSIS — E039 Hypothyroidism, unspecified: Secondary | ICD-10-CM | POA: Diagnosis not present

## 2021-02-19 DIAGNOSIS — M797 Fibromyalgia: Secondary | ICD-10-CM

## 2021-02-19 DIAGNOSIS — F325 Major depressive disorder, single episode, in full remission: Secondary | ICD-10-CM

## 2021-02-19 DIAGNOSIS — E063 Autoimmune thyroiditis: Secondary | ICD-10-CM

## 2021-02-19 DIAGNOSIS — E559 Vitamin D deficiency, unspecified: Secondary | ICD-10-CM

## 2021-02-19 DIAGNOSIS — G4700 Insomnia, unspecified: Secondary | ICD-10-CM

## 2021-02-19 DIAGNOSIS — J302 Other seasonal allergic rhinitis: Secondary | ICD-10-CM

## 2021-02-19 MED ORDER — DULOXETINE HCL 40 MG PO CPEP: 1.0000 | ORAL_CAPSULE | Freq: Every day | ORAL | 1 refills | Status: DC

## 2021-02-19 MED ORDER — CYCLOBENZAPRINE HCL 10 MG PO TABS: 5.0000 mg | ORAL_TABLET | Freq: Three times a day (TID) | ORAL | 0 refills | Status: DC | PRN

## 2021-02-19 MED ORDER — VITAMIN D (ERGOCALCIFEROL) 1.25 MG (50000 UNIT) PO CAPS: 50000.0000 [IU] | ORAL_CAPSULE | ORAL | 1 refills | Status: DC

## 2021-02-19 MED ORDER — SYNTHROID 100 MCG PO TABS: ORAL_TABLET | ORAL | 1 refills | Status: DC

## 2021-02-19 MED ORDER — EPINEPHRINE 0.3 MG/0.3ML IJ SOAJ: 0.3000 mg | INTRAMUSCULAR | 1 refills | Status: DC | PRN

## 2021-02-19 MED ORDER — LIOTHYRONINE SODIUM 5 MCG PO TABS: 5.0000 ug | ORAL_TABLET | Freq: Every day | ORAL | 1 refills | Status: DC

## 2021-02-19 MED ORDER — LEVOCETIRIZINE DIHYDROCHLORIDE 5 MG PO TABS: 5.0000 mg | ORAL_TABLET | Freq: Two times a day (BID) | ORAL | 3 refills | Status: DC

## 2021-02-19 MED ORDER — TRAZODONE HCL 50 MG PO TABS
25.0000 mg | ORAL_TABLET | Freq: Every evening | ORAL | 1 refills | Status: DC | PRN
Start: 2021-02-19 — End: 2021-10-27

## 2021-02-19 NOTE — Progress Notes (Signed)
Subjective:    Patient ID: Melinda Browning, female    DOB: 08-30-1966, 55 y.o.   MRN: [id number]  HPI  Pt is a 55 yo female with hypothyroidism, fibromyalgia, GERD, asthma, depression, anxiety, lumbar DDD with chronic back pain who presents to the clinic for follow up and medication refills.   Pt had right carpal tunnel surgery. She is recovering well. Not back at work.   She does feel like she is doing better. Her pain is controlled better. Her mood is better. She is taking her medication daily.   Her sleep is not great. Continues to struggle going and staying asleep. Melatonin not working.   She would like to add cytomel to levothyroxine. She has been on before and made her feel better.   .. Active Ambulatory Problems    Diagnosis Date Noted  . GERD (gastroesophageal reflux disease) 03/22/2018  . Hypothyroidism 03/22/2018  . Fibromyalgia 03/22/2018  . Depression, major, single episode, complete remission (HCC) 03/22/2018  . Class 1 obesity due to excess calories with serious comorbidity and body mass index (BMI) of 34.0 to 34.9 in adult 03/22/2018  . No energy 03/25/2018  . Thyroid nodule 08/03/2018  . Vitamin D deficiency 08/06/2018  . History of C5-C6 cervical ACDF 01/08/2019  . Lumbar degenerative disc disease 01/17/2019  . SOB (shortness of breath) 07/02/2019  . Cough 07/02/2019  . Abnormal computed tomography angiography (CTA) 07/02/2019  . Low serum vitamin B12 08/27/2019  . Pain in joint involving multiple sites 08/27/2019  . Elevated blood pressure reading 09/27/2019  . B12 deficiency 09/27/2019  . 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 Rash 04/28/2020  . Mild persistent asthma 06/17/2020  . Allergic rhinitis with probable nonallergic component 06/17/2020  . Adverse food reaction 06/17/2020  . Bloating 06/19/2020  . Intertrigo 06/19/2020  . Hashimoto's thyroiditis 06/24/2020  . Medication side effect 08/03/2020  . Bilateral swelling of feet and ankles 09/15/2020  .  Acute nonintractable headache 09/15/2020  . Golfers elbow of left upper extremity 10/23/2020  . Bilateral carpal tunnel syndrome 10/28/2020   Resolved Ambulatory Problems    Diagnosis Date Noted  . Acute pain of left shoulder 01/08/2019  . History of fusion of cervical spine 01/08/2019  . Neck pain 01/09/2019  . Numbness and tingling of left hand 10/23/2020  . Numbness and tingling of right hand 10/23/2020   Past Medical History:  Diagnosis Date  . DDD (degenerative disc disease), lumbar   . Depression       Review of Systems See HPI.     Objective:   Physical Exam Vitals reviewed.  Constitutional:      Appearance: Normal appearance.  Cardiovascular:     Rate and Rhythm: Normal rate and regular rhythm.     Pulses: Normal pulses.     Heart sounds: Normal heart sounds.  Pulmonary:     Effort: Pulmonary effort is normal.     Breath sounds: Normal breath sounds.  Neurological:     General: No focal deficit present.     Mental Status: She is alert and oriented to person, place, and time.  Psychiatric:        Mood and Affect: Mood normal.     .. Depression screen [facility] 2/9 04/15/2020 09/23/2019 07/24/2019 01/08/2019 08/06/2018  Decreased Interest 0 0 2 0 1  Down, Depressed, Hopeless 0 0 0 0 0  PHQ - 2 Score 0 0 2 0 1  Altered sleeping 1 1 3 3  0  Tired, decreased energy 1 0  1 3 3   Change in appetite 3 1 1  0 1  Feeling bad or failure about yourself  0 0 0 0 0  Trouble concentrating 0 1 2 0 1  Moving slowly or fidgety/restless 0 0 1 0 0  Suicidal thoughts 0 0 0 0 0  PHQ-9 Score 5 3 10 6 6   Difficult doing work/chores Somewhat difficult Not difficult at all Somewhat difficult Not difficult at all Somewhat difficult   . GAD 7 : Generalized Anxiety Score 04/15/2020 09/23/2019 07/24/2019 01/08/2019  Nervous, Anxious, on Edge 0 0 1 0  Control/stop worrying 0 0 0 0  Worry too much - different things 0 0 0 0  Trouble relaxing 1 0 1 0  Restless 1 0 1 0  Easily annoyed or  irritable 0 0 1 0  Afraid - awful might happen 0 0 0 0  Total GAD 7 Score 2 0 4 0  Anxiety Difficulty Somewhat difficult Not difficult at all Somewhat difficult -          Assessment & Plan:  10/21/20208/21/2020[name] was seen today for medication refill.  Diagnoses and all orders for this visit:  Depression, major, single episode, complete remission (HCC)  Acquired hypothyroidism -     SYNTHROID 100 MCG tablet; TAKE 1 TABLET BY MOUTH ONCE DAILY. -     liothyronine (CYTOMEL) 5 MCG tablet; Take 1 tablet (5 mcg total) by mouth daily.  Vitamin D deficiency -     Vitamin D, Ergocalciferol, (DRISDOL) 1.25 MG (50000 UNIT) CAPS capsule; Take 1 capsule (50,000 Units total) by mouth every 7 (seven) days.  Fibromyalgia -     DULoxetine HCl 40 MG CPEP; Take 1 capsule by mouth daily.  Hashimoto's thyroiditis  Lumbar degenerative disc disease -     cyclobenzaprine (FLEXERIL) 10 MG tablet; Take 0.5-1 tablets (5-10 mg total) by mouth 3 (three) times daily as needed for muscle spasms. Caution: can cause drowsiness  Seasonal allergies -     levocetirizine (XYZAL) 5 MG tablet; Take 1 tablet (5 mg total) by mouth in the morning and at bedtime.  Insomnia, unspecified type -     traZODone (DESYREL) 50 MG tablet; Take 0.5-1 tablets (25-50 mg total) by mouth at bedtime as needed for sleep.  Other orders -     EPINEPHrine 0.3 mg/0.3 mL IJ SOAJ injection; Inject 0.3 mg into the muscle as needed for anaphylaxis.   Refilled medications.  Added trazodone for sleep. Discussed good sleep routine.  Continue on medications for fibromyalgia and mood as they are linked.  Added cytomel to levothyroxine. Recheck labs in 6-8 weeks.   Follow up in 3 months.

## 2021-02-23 ENCOUNTER — Encounter: Payer: Self-pay | Admitting: Physician Assistant

## 2021-04-23 ENCOUNTER — Other Ambulatory Visit: Payer: Self-pay

## 2021-04-23 ENCOUNTER — Encounter: Payer: Self-pay | Admitting: Medical-Surgical

## 2021-04-23 ENCOUNTER — Ambulatory Visit: Payer: BC Managed Care – PPO | Admitting: Medical-Surgical

## 2021-04-23 VITALS — BP 125/80 | HR 88 | Temp 98.5°F | Ht 63.5 in | Wt 196.0 lb

## 2021-04-23 DIAGNOSIS — L508 Other urticaria: Secondary | ICD-10-CM | POA: Diagnosis not present

## 2021-04-23 MED ORDER — FAMOTIDINE 20 MG PO TABS: 20.0000 mg | ORAL_TABLET | Freq: Two times a day (BID) | ORAL | 1 refills | Status: DC

## 2021-04-23 MED ORDER — METHYLPREDNISOLONE ACETATE 80 MG/ML IJ SUSP
80.0000 mg | Freq: Once | INTRAMUSCULAR | Status: AC
  Administered 2021-04-23: 80 mg via INTRAMUSCULAR

## 2021-04-23 MED ORDER — PREDNISONE 10 MG (48) PO TBPK: ORAL_TABLET | Freq: Every day | ORAL | 0 refills | Status: DC

## 2021-04-23 MED ORDER — TRIAMCINOLONE ACETONIDE 0.1 % EX OINT: 1.0000 "application " | TOPICAL_OINTMENT | Freq: Two times a day (BID) | CUTANEOUS | 6 refills | Status: DC

## 2021-04-23 NOTE — Progress Notes (Signed)
Subjective:    CC: Hives  HPI: Pleasant 55 year old female presenting today for evaluation of hives.  Notes this started 2 weeks ago.  Has happened in the past and often occurs when she has exposure to gluten.  She is unsure what the causes this time since she has not eaten at any other restaurants and has not been exposed to gluten.  The hives have been on her chest, arms, and legs and now have moved to redness under her breasts.  Her last episode similar to this she was originally thought to have intertrigo under her breasts but the antifungals made no difference.  She is currently taking Xyzal 5 mg daily but discontinued famotidine that was prescribed by her allergist.  Denies fever, chills, chest pain, shortness of breath and swelling of the mouth/throat.  I reviewed the past medical history, family history, social history, surgical history, and allergies today and no changes were needed.  Please see the problem list section below in epic for further details.  Past Medical History: Past Medical History:  Diagnosis Date  . DDD (degenerative disc disease), lumbar   . Depression   . Fibromyalgia   . GERD (gastroesophageal reflux disease)   . Hypothyroidism    Past Surgical History: Past Surgical History:  Procedure Laterality Date  . CERVICAL FUSION  2009  . CESAREAN SECTION  [phone]  . LUMBAR LAMINECTOMY  2014   Social History: Social History   Socioeconomic History  . Marital status: Widowed    Spouse name: Not on file  . Number of children: Not on file  . Years of education: Not on file  . Highest education level: Not on file  Occupational History  . Not on file  Tobacco Use  . Smoking status: Never Smoker  . Smokeless tobacco: Never Used  Vaping Use  . Vaping Use: Never used  Substance and Sexual Activity  . Alcohol use: Never  . Drug use: Never  . Sexual activity: Not Currently  Other Topics Concern  . Not on file  Social History Narrative  . Not on file    Social Determinants of Health   Financial Resource Strain: Not on file  Food Insecurity: Not on file  Transportation Needs: Not on file  Physical Activity: Not on file  Stress: Not on file  Social Connections: Not on file   Family History: Family History  Problem Relation Age of Onset  . Hypertension Mother   . Hypertension Sister   . Hypertension Brother   . Asthma Brother   . Allergic rhinitis Son   . Asthma Daughter   . Allergic rhinitis Daughter   . Eczema Daughter   . Angioedema Neg Hx   . Immunodeficiency Neg Hx   . Urticaria Neg Hx    Allergies: Allergies  Allergen Reactions  . Gluten Meal Swelling    ?Angioedema  . Lyrica [Pregabalin] Swelling  . Omnicef [Cefdinir] Swelling    Angioedema  . Pineapple Swelling  . Trintellix [Vortioxetine] Diarrhea and Nausea Only  . Naproxen Rash    Face rash   Medications: See med rec.  Review of Systems: See HPI for pertinent positives and negatives.   Objective:    General: Well Developed, well nourished, and in no acute distress.  Neuro: Alert and oriented x3.  HEENT: Normocephalic, atraumatic.  Skin: Warm and dry.  Scattered scabbing and erythematous lesions over all extremities and chest.  Erythematous macules under bilateral breasts. Cardiac: Regular rate and rhythm, no murmurs rubs or gallops,  no lower extremity edema.  Respiratory: Clear to auscultation bilaterally. Not using accessory muscles, speaking in full sentences.  Impression and Recommendations:    1. Recurrent urticaria Due to the severity and widespread nature of her symptoms, giving Depo-Medrol 80 mg IM x1 in the office.  Start 12-day prednisone taper tomorrow.  Continue Xyzal 5 mg daily.  Start famotidine 20 mg twice daily.  Sending triamcinolone ointment for sparing use to the area under the breast. - methylPREDNISolone acetate (DEPO-MEDROL) injection 80 mg  Return if symptoms worsen or fail to  improve. ___________________________________________ Thayer Ohm, DNP, APRN, FNP-BC Primary Care and Sports Medicine [facility]

## 2021-05-06 ENCOUNTER — Ambulatory Visit: Payer: BC Managed Care – PPO | Admitting: Medical-Surgical

## 2021-05-06 ENCOUNTER — Other Ambulatory Visit: Payer: Self-pay

## 2021-05-06 ENCOUNTER — Encounter: Payer: Self-pay | Admitting: Medical-Surgical

## 2021-05-06 VITALS — BP 124/74 | HR 81 | Ht 63.5 in | Wt 194.0 lb

## 2021-05-06 DIAGNOSIS — L508 Other urticaria: Secondary | ICD-10-CM | POA: Diagnosis not present

## 2021-05-06 MED ORDER — MONTELUKAST SODIUM 10 MG PO TABS: 10.0000 mg | ORAL_TABLET | Freq: Every day | ORAL | 1 refills | Status: DC

## 2021-05-06 NOTE — Progress Notes (Signed)
Subjective:    CC: rash  HPI: Melinda Browning 55 year old female presenting today for evaluation of her rash.  She was seen on 5/21 for recurrent urticaria.  At that time she was treated with an injection of Depo-Medrol 80 mg followed by a 12-day prednisone taper and topical triamcinolone as needed.  She completed her 12 days of prednisone yesterday.  Notes that her urticaria cleared up but as her dose lowered on the taper, she noticed new spots erupting on her bilateral forearms.  She has had no further exposures to any known allergens and has been studiously avoiding gluten.  She has been taking Xyzal 5 mg once daily.  She did start famotidine 20 mg twice daily as instructed at our last visit.  She does have multiple topical agents and lotions at home that she has been using but without much relief.  She has tried Benadryl as well as cool compresses but nothing seems to help with the itching.  Denies fever, chills, shortness of breath, and difficulty swallowing.  I reviewed the past medical history, family history, social history, surgical history, and allergies today and no changes were needed.  Please see the problem list section below in epic for further details.  Past Medical History: Past Medical History:  Diagnosis Date  . DDD (degenerative disc disease), lumbar   . Depression   . Fibromyalgia   . GERD (gastroesophageal reflux disease)   . Hypothyroidism    Past Surgical History: Past Surgical History:  Procedure Laterality Date  . CERVICAL FUSION  2009  . CESAREAN SECTION  [phone]  . LUMBAR LAMINECTOMY  2014   Social History: Social History   Socioeconomic History  . Marital status: Widowed    Spouse name: Not on file  . Number of children: Not on file  . Years of education: Not on file  . Highest education level: Not on file  Occupational History  . Not on file  Tobacco Use  . Smoking status: Never Smoker  . Smokeless tobacco: Never Used  Vaping Use  . Vaping Use:  Never used  Substance and Sexual Activity  . Alcohol use: Never  . Drug use: Never  . Sexual activity: Not Currently  Other Topics Concern  . Not on file  Social History Narrative  . Not on file   Social Determinants of Health   Financial Resource Strain: Not on file  Food Insecurity: Not on file  Transportation Needs: Not on file  Physical Activity: Not on file  Stress: Not on file  Social Connections: Not on file   Family History: Family History  Problem Relation Age of Onset  . Hypertension Mother   . Hypertension Sister   . Hypertension Brother   . Asthma Brother   . Allergic rhinitis Son   . Asthma Daughter   . Allergic rhinitis Daughter   . Eczema Daughter   . Angioedema Neg Hx   . Immunodeficiency Neg Hx   . Urticaria Neg Hx    Allergies: Allergies  Allergen Reactions  . Gluten Meal Swelling    ?Angioedema  . Lyrica [Pregabalin] Swelling  . Omnicef [Cefdinir] Swelling    Angioedema  . Pineapple Swelling  . Trintellix [Vortioxetine] Diarrhea and Nausea Only  . Naproxen Rash    Face rash   Medications: See med rec.  Review of Systems: See HPI for pertinent positives and negatives.   Objective:    General: Well Developed, well nourished, and in no acute distress.  Neuro: Alert and oriented x3.  HEENT: Normocephalic, atraumatic.  Skin: Warm and dry.  Scattered urticarial lesions to bilateral forearms and under breasts. Cardiac: Regular rate and rhythm, no murmurs rubs or gallops, no lower extremity edema.  Respiratory: Clear to auscultation bilaterally. Not using accessory muscles, speaking in full sentences.   Impression and Recommendations:    1. Recurrent urticaria Improvement with steroid taper but recurrence of urticaria over the last few days as the dose lowered.  Advised patient that treatment with chronic steroids is not indicated and can be risky.  Holding off on further prednisone today.  Increase levocetirizine 5 mg to twice daily.   Continue famotidine 20 mg twice daily.  Adding Singulair 10 mg at bedtime.  Discussed topical options including triamcinolone, Benadryl, hydrating lotions and anti-itch preparations available over-the-counter.  Cautioned against overusing triamcinolone due to the risk of skin changes.  Strongly recommend keeping her appointment with her allergist on 6/13.  Return if symptoms worsen or fail to improve. ___________________________________________ Thayer Ohm, DNP, APRN, FNP-BC Primary Care and Sports Medicine [facility] [place]

## 2021-05-11 ENCOUNTER — Encounter: Payer: Self-pay | Admitting: Allergy

## 2021-05-11 ENCOUNTER — Other Ambulatory Visit: Payer: Self-pay

## 2021-05-11 ENCOUNTER — Telehealth: Payer: Self-pay

## 2021-05-11 ENCOUNTER — Ambulatory Visit: Payer: BC Managed Care – PPO | Admitting: Allergy

## 2021-05-11 VITALS — BP 124/78 | HR 82 | Temp 98.2°F | Resp 16 | Ht 64.0 in | Wt 195.0 lb

## 2021-05-11 DIAGNOSIS — L2089 Other atopic dermatitis: Secondary | ICD-10-CM | POA: Insufficient documentation

## 2021-05-11 DIAGNOSIS — J4530 Mild persistent asthma, uncomplicated: Secondary | ICD-10-CM

## 2021-05-11 DIAGNOSIS — T781XXD Other adverse food reactions, not elsewhere classified, subsequent encounter: Secondary | ICD-10-CM

## 2021-05-11 DIAGNOSIS — J3089 Other allergic rhinitis: Secondary | ICD-10-CM

## 2021-05-11 DIAGNOSIS — R21 Rash and other nonspecific skin eruption: Secondary | ICD-10-CM

## 2021-05-11 MED ORDER — MOMETASONE FUROATE 0.1 % EX OINT: TOPICAL_OINTMENT | Freq: Two times a day (BID) | CUTANEOUS | 1 refills | Status: DC | PRN

## 2021-05-11 MED ORDER — FLUCONAZOLE 150 MG PO TABS: ORAL_TABLET | ORAL | 0 refills | Status: DC

## 2021-05-11 NOTE — Assessment & Plan Note (Addendum)
History of rashes/recurrent urticaria. Current rash episode started 4 weeks ago. Seen by PCP and treated with depo and oral prednisone with minimal benefit. Gluten usually flares this but she had no gluten exposures.   Discussed with patient that her current rash does NOT look like urticaria at all. She has 3 different types of rashes present today.  1. Rash under the breast looks like a fungal rash.  Keep the area dry under the breast.  Take diflucan 150mg  once a day and if no improvement after 3 days then take another diflucan 150mg  pill. Apparently this has been resistant to topical fungal creams in the past.   2. Rash on the antecubital fossa area most likely atopic dermatitis.  Use mometasone 0.1% ointment twice a day as needed for eczema flares. Do not use on the face, neck, armpits or groin area. Do not use more than 3 weeks in a row.   3. Rash on the forearms - unclear etiology/diagnosis of this rash.  Use mometasone 0.1% ointment twice a day as needed. If no improvement, strongly recommend dermatology evaluation next for skin biopsy - this was recommended at the last office on 06/17/2020. For the itching:  Continue Xyzal 5mg  twice a day. Continue Pepcid (famotidine) 20mg  twice a day.  Continue proper skin care as below.

## 2021-05-11 NOTE — Patient Instructions (Addendum)
Rash:  This rash does not look like hives anymore.   Concerning for eczema or other rash on the arms and fungal infection under the breast.  Eczema:   Use mometasone 0.1% ointment twice a day as needed for eczema flares. Do not use on the face, neck, armpits or groin area. Do not use more than 3 weeks in a row.   See below for proper skin care. For the itching:  Continue xyzal 5mg  twice a day. Continue pepcid (famotidine) 20mg  twice a day.   Fungal infection:  Keep the area dry under the breast.  Take diflucan 150mg  once a day and if no improvement after 3 days then take another diflucan 150mg  pill   If no improvement recommend dermatology referral next for skin biopsy.  Asthma: . Daily controller medication(s): continue Flovent 2 puffs twice a day. . May use albuterol rescue inhaler 2 puffs every 4 to 6 hours as needed for shortness of breath, chest tightness, coughing, and wheezing. May use albuterol rescue inhaler 2 puffs 5 to 15 minutes prior to strenuous physical activities. Monitor frequency of use.  . Asthma control goals:  o Full participation in all desired activities (may need albuterol before activity) o Albuterol use two times or less a week on average (not counting use with activity) o Cough interfering with sleep two times or less a month o Oral steroids no more than once a year o No hospitalizations  Food allergy:  Avoid gluten, pineapple, cherries, and blueberries.  For mild symptoms you can take over the counter antihistamines such as Benadryl and monitor symptoms closely. If symptoms worsen or if you have severe symptoms including breathing issues, throat closure, significant swelling, whole body hives, severe diarrhea and vomiting, lightheadedness then inject epinephrine and seek immediate medical care afterwards.  Allergic rhinitis:  Use Flonase (fluticasone) nasal spray 1 spray per nostril twice a day as needed for nasal congestion.   Use over  the counter antihistamines such as Zyrtec (cetirizine), Claritin (loratadine), Allegra (fexofenadine), or Xyzal (levocetirizine) daily as needed. May take twice a day during allergy flares. May switch antihistamines every few months.

## 2021-05-11 NOTE — Assessment & Plan Note (Signed)
Stable.  Use Flonase (fluticasone) nasal spray 1 spray per nostril twice a day as needed for nasal congestion.   Use over the counter antihistamines such as Zyrtec (cetirizine), Claritin (loratadine), Allegra (fexofenadine), or Xyzal (levocetirizine) daily as needed. May take twice a day during allergy flares. May switch antihistamines every few months.

## 2021-05-11 NOTE — Telephone Encounter (Signed)
Pt called and LVM at 8:27 AM stating that her hives have not improved. Looking at her last OV note, she was to keep the appt with the Allergist on 05/17/21. I called pt back to get more info and she said that she called the allergist after calling us and has an appt with them this morning. Looking at her appt dashboard she is scheduled for an appt this morning at 11:30. I told her that hopefully they will be able to find something to help with her hives. No further questions or concerns at this time.

## 2021-05-11 NOTE — Assessment & Plan Note (Signed)
Noted chest tightness today. Has not taken any of her inhalers today.  Today's spirometry was normal. . Daily controller medication(s): Flovent 2 puffs twice a day and rinse mouth after each use.  . May use albuterol rescue inhaler 2 puffs every 4 to 6 hours as needed for shortness of breath, chest tightness, coughing, and wheezing. May use albuterol rescue inhaler 2 puffs 5 to 15 minutes prior to strenuous physical activities. Monitor frequency of use.

## 2021-05-11 NOTE — Assessment & Plan Note (Signed)
No reactions.  Avoid gluten, pineapple, cherries, and blueberries.  For mild symptoms you can take over the counter antihistamines such as Benadryl and monitor symptoms closely. If symptoms worsen or if you have severe symptoms including breathing issues, throat closure, significant swelling, whole body hives, severe diarrhea and vomiting, lightheadedness then inject epinephrine and seek immediate medical care afterwards.

## 2021-05-11 NOTE — Progress Notes (Signed)
Follow Up Note  RE: Dezire Turk MRN: [id number] DOB: Jan 27, 1966 Date of Office Visit: 05/11/2021  Referring provider: Lavada Mesi Primary care provider: Donella Stade, PA-C  Chief Complaint: Urticaria and Asthma (Chest tightness started today)  History of Present Illness: I had the pleasure of seeing Melinda Browning for a follow up visit at the [facility] on 05/11/2021. She is a 55 y.o. female, who is being followed for recurrent urticaria, asthma, allergic rhinitis and food adverse food reaction. Her previous allergy office visit was on 06/17/2020 with Dr. Verlin Fester. Today is a new complaint visit of rash.  Rash  Patient started to break out in rashes again 4 weeks ago which is still there on her arms and under the breast. She was given a depo 12m injection and oral prednisone by the PCP on 04/23/2021 which apparently dried up the rash on her arm.   Prior to this she is not sure how often she was breaking out.  She does have a gluten allergy and has been avoiding gluten and denies having any gluten exposure when the rash started. Denies any insect/bug bites or poison ivy or other plant exposure on the arms.   She also has a rash under her breasts as well and a few spots on her legs, neck and face. She states she has fungal cream for the breast which does not help.   Describes the rash as itchy, red raised. Individual rashes has been lasting for more than a few days and the rash on her arm has been there for over a week. No ecchymosis upon resolution. Associated symptoms include: none. Suspected triggers are unknown. Denies any fevers, chills, foods, personal care products or recent infections. Systemic steroids yes. Currently on Singulair 167mdaily, famotidine 2043mwice a day, Xyzal 5mg77mice a day.  She was started on a new medication - cytomel around this time.  Previous work up includes: tryptase, H. pylori serology, CBC, CMP, ESR, ANA, and alpha-gal panel all  negative.  No prior dermatology/skin biopsy evaluation.   Patient is not the best historian.   Mild persistent asthma Noted some chest tightness today. She did not take her inhalers today. She usually takes albuterol prn and Flovent 110mc5mpuffs once a day and not twice a day as prescribed.   Allergic rhinitis  Currently using Flonase 2 sprays per nostril QHS and no nosebleeds.  Symptoms stable.   Adverse food reaction Currently avoiding gluten, pineapple, cherries, and blueberries. No EpiPen use.   Apparently patient's insurance card has expired.  Patient was crying and upset during the visit after I explained that her current rash does not look like hives and she does not need any additional steroids.  Assessment and Plan: Melinda Browning 55 y.46 female with: Rash and other nonspecific skin eruption History of rashes/recurrent urticaria. Current rash episode started 4 weeks ago. Seen by PCP and treated with depo and oral prednisone with minimal benefit. Gluten usually flares this but she had no gluten exposures.   Discussed with patient that her current rash does NOT look like urticaria at all. She has 3 different types of rashes present today.  1. Rash under the breast looks like a fungal rash.  Keep the area dry under the breast.  Take diflucan 150mg 35m a day and if no improvement after 3 days then take another diflucan 150mg p51m Apparently this has been resistant to topical fungal creams in the past.  2. Rash on the antecubital fossa area most likely atopic dermatitis.  Use mometasone 0.1% ointment twice a day as needed for eczema flares. Do not use on the face, neck, armpits or groin area. Do not use more than 3 weeks in a row.   3. Rash on the forearms - unclear etiology/diagnosis of this rash.  Use mometasone 0.1% ointment twice a day as needed. If no improvement, strongly recommend dermatology evaluation next for skin biopsy - this was recommended at the last  office on 06/17/2020. For the itching:  Continue Xyzal 5m twice a day. Continue Pepcid (famotidine) 244mtwice a day.  Continue proper skin care as below.  Mild persistent asthma Noted chest tightness today. Has not taken any of her inhalers today.  Today's spirometry was normal. . Daily controller medication(s): Flovent 11064m2 puffs twice a day and rinse mouth after each use.  . May use albuterol rescue inhaler 2 puffs every 4 to 6 hours as needed for shortness of breath, chest tightness, coughing, and wheezing. May use albuterol rescue inhaler 2 puffs 5 to 15 minutes prior to strenuous physical activities. Monitor frequency of use.   Allergic rhinitis with probable nonallergic component Stable.  Use Flonase (fluticasone) nasal spray 1 spray per nostril twice a day as needed for nasal congestion.   Use over the counter antihistamines such as Zyrtec (cetirizine), Claritin (loratadine), Allegra (fexofenadine), or Xyzal (levocetirizine) daily as needed. May take twice a day during allergy flares. May switch antihistamines every few months.  Adverse food reaction No reactions.  Avoid gluten, pineapple, cherries, and blueberries.  For mild symptoms you can take over the counter antihistamines such as Benadryl and monitor symptoms closely. If symptoms worsen or if you have severe symptoms including breathing issues, throat closure, significant swelling, whole body hives, severe diarrhea and vomiting, lightheadedness then inject epinephrine and seek immediate medical care afterwards.  Meds ordered this encounter  Medications  . mometasone (ELOCON) 0.1 % ointment    Sig: Apply topically 2 (two) times daily as needed. Do not use on the face, neck, armpits or groin area. Do not use more than 3 weeks in a row.    Dispense:  45 g    Refill:  1  . fluconazole (DIFLUCAN) 150 MG tablet    Sig: Take 1 tablet and may take another tablet after 3 days if needed.    Dispense:  2 tablet    Refill:   0   Lab Orders  No laboratory test(s) ordered today    Diagnostics: Spirometry:  Tracings reviewed. Her effort: Good reproducible efforts. FVC: 2.70L FEV1: 2.31L, 85% predicted FEV1/FVC ratio: 86% Interpretation: Spirometry consistent with normal pattern.  Please see scanned spirometry results for details.  Medication List:  Current Outpatient Medications  Medication Sig Dispense Refill  . albuterol (VENTOLIN HFA) 108 (90 Base) MCG/ACT inhaler Inhale 2 puffs into the lungs every 4 (four) hours as needed for wheezing or shortness of breath. 18 g 1  . cyclobenzaprine (FLEXERIL) 10 MG tablet Take 0.5-1 tablets (5-10 mg total) by mouth 3 (three) times daily as needed for muscle spasms. Caution: can cause drowsiness 90 tablet 0  . DULoxetine HCl 40 MG CPEP Take 1 capsule by mouth daily. 90 capsule 1  . EPINEPHrine 0.3 mg/0.3 mL IJ SOAJ injection Inject 0.3 mg into the muscle as needed for anaphylaxis. 1 each 1  . famotidine (PEPCID) 20 MG tablet Take 1 tablet (20 mg total) by mouth 2 (two) times daily. 60 tablet  1  . fluconazole (DIFLUCAN) 150 MG tablet Take 1 tablet and may take another tablet after 3 days if needed. 2 tablet 0  . fluticasone (FLONASE) 50 MCG/ACT nasal spray 2 sprays per nostril once daily as needed for stuffy nose. 16 g 5  . fluticasone (FLOVENT HFA) 110 MCG/ACT inhaler 2 puffs twice daily with spacer to prevent coughing or wheezing. 1 Inhaler 5  . gabapentin (NEURONTIN) 800 MG tablet Take 1 tablet (800 mg total) by mouth 3 (three) times daily. 90 tablet 3  . levocetirizine (XYZAL) 5 MG tablet Take 1 tablet (5 mg total) by mouth in the morning and at bedtime. 180 tablet 3  . liothyronine (CYTOMEL) 5 MCG tablet Take 1 tablet (5 mcg total) by mouth daily. 90 tablet 1  . mometasone (ELOCON) 0.1 % ointment Apply topically 2 (two) times daily as needed. Do not use on the face, neck, armpits or groin area. Do not use more than 3 weeks in a row. 45 g 1  . montelukast (SINGULAIR)  10 MG tablet Take 1 tablet (10 mg total) by mouth at bedtime. 30 tablet 1  . SYNTHROID 100 MCG tablet TAKE 1 TABLET BY MOUTH ONCE DAILY. 90 tablet 1  . traZODone (DESYREL) 50 MG tablet Take 0.5-1 tablets (25-50 mg total) by mouth at bedtime as needed for sleep. 30 tablet 1  . triamcinolone ointment (KENALOG) 0.1 % Apply 1 application topically 2 (two) times daily. To affected areas 60 g 6  . Vitamin D, Ergocalciferol, (DRISDOL) 1.25 MG (50000 UNIT) CAPS capsule Take 1 capsule (50,000 Units total) by mouth every 7 (seven) days. 12 capsule 1   No current facility-administered medications for this visit.   Allergies: Allergies  Allergen Reactions  . Gluten Meal Swelling    ?Angioedema  . Lyrica [Pregabalin] Swelling  . Omnicef [Cefdinir] Swelling    Angioedema  . Pineapple Swelling  . Trintellix [Vortioxetine] Diarrhea and Nausea Only  . Naproxen Rash    Face rash   I reviewed her past medical history, social history, family history, and environmental history and no significant changes have been reported from her previous visit.  Review of Systems  Constitutional: Negative for appetite change, chills, fever and unexpected weight change.  HENT: Negative for congestion and rhinorrhea.   Eyes: Negative for itching.  Respiratory: Negative for cough, chest tightness, shortness of breath and wheezing.   Gastrointestinal: Negative for abdominal pain.  Skin: Positive for rash.  Neurological: Negative for headaches.   Objective: BP 124/78   Pulse 82   Temp 98.2 F (36.8 C) (Temporal)   Resp 16   Ht 5' 4"  (1.626 m)   Wt 195 lb (88.5 kg)   SpO2 98%   BMI 33.47 kg/m  Body mass index is 33.47 kg/m. Physical Exam Vitals and nursing note reviewed.  Constitutional:      Appearance: Normal appearance. She is well-developed.  HENT:     Head: Normocephalic and atraumatic.     Right Ear: Tympanic membrane and external ear normal.     Left Ear: Tympanic membrane and external ear normal.      Nose: Nose normal.     Mouth/Throat:     Mouth: Mucous membranes are moist.     Pharynx: Oropharynx is clear.  Eyes:     Conjunctiva/sclera: Conjunctivae normal.  Cardiovascular:     Rate and Rhythm: Normal rate and regular rhythm.     Heart sounds: Normal heart sounds. No murmur heard.   Pulmonary:  Effort: Pulmonary effort is normal.     Breath sounds: Normal breath sounds. No wheezing, rhonchi or rales.  Musculoskeletal:     Cervical back: Neck supple.  Skin:    General: Skin is warm.     Findings: Rash present.     Comments: Erythematous, dry, raised areas on posterior forearm area b/l with some papular areas. Erythematous hue with some hyperpigmentation under the breasts b/l. Excoriations marks on lower extremities b/l. Dry, erythematous patch on right antecubital fossa area.   Neurological:     Mental Status: She is alert and oriented to person, place, and time.  Psychiatric:        Behavior: Behavior normal.    Previous notes and tests were reviewed. The plan was reviewed with the patient/family, and all questions/concerned were addressed.  It was my pleasure to see Mackenna today and participate in her care. Please feel free to contact me with any questions or concerns.  Sincerely,  Rexene Alberts, DO Allergy & Immunology  [facility] office: [place] office: [phone]

## 2021-05-17 ENCOUNTER — Ambulatory Visit: Payer: BC Managed Care – PPO | Admitting: Allergy & Immunology

## 2021-05-17 DIAGNOSIS — J309 Allergic rhinitis, unspecified: Secondary | ICD-10-CM

## 2021-05-24 ENCOUNTER — Ambulatory Visit (INDEPENDENT_AMBULATORY_CARE_PROVIDER_SITE_OTHER): Payer: BC Managed Care – PPO | Admitting: Physician Assistant

## 2021-05-24 DIAGNOSIS — Z5329 Procedure and treatment not carried out because of patient's decision for other reasons: Secondary | ICD-10-CM

## 2021-05-24 NOTE — Progress Notes (Signed)
No show

## 2021-06-09 ENCOUNTER — Encounter: Payer: Self-pay | Admitting: Physician Assistant

## 2021-06-09 DIAGNOSIS — E039 Hypothyroidism, unspecified: Secondary | ICD-10-CM

## 2021-06-24 MED ORDER — SYNTHROID 100 MCG PO TABS: ORAL_TABLET | ORAL | 1 refills | Status: DC

## 2021-06-24 NOTE — Telephone Encounter (Signed)
Received request from [facility] pharmacy for Synthroid. RX sent.

## 2021-07-22 ENCOUNTER — Other Ambulatory Visit: Payer: Self-pay | Admitting: Medical-Surgical

## 2021-08-04 ENCOUNTER — Encounter: Payer: Self-pay | Admitting: Family Medicine

## 2021-08-07 ENCOUNTER — Other Ambulatory Visit: Payer: Self-pay | Admitting: Medical-Surgical

## 2021-08-07 ENCOUNTER — Other Ambulatory Visit: Payer: Self-pay | Admitting: Physician Assistant

## 2021-08-07 DIAGNOSIS — M797 Fibromyalgia: Secondary | ICD-10-CM

## 2021-08-07 DIAGNOSIS — E039 Hypothyroidism, unspecified: Secondary | ICD-10-CM

## 2021-09-06 IMAGING — US US THYROID
1 series · 13 of 25 positions shown · non-contrast
Comparison: None.

CLINICAL DATA: Prior ultrasound follow-up.

EXAM:
THYROID ULTRASOUND
TECHNIQUE: Ultrasound examination of the thyroid gland and adjacent soft
tissues was performed.

[Series 1: us thyroid · 0.07mm/px · 13 of 34 slices shown]
[im 1/34]
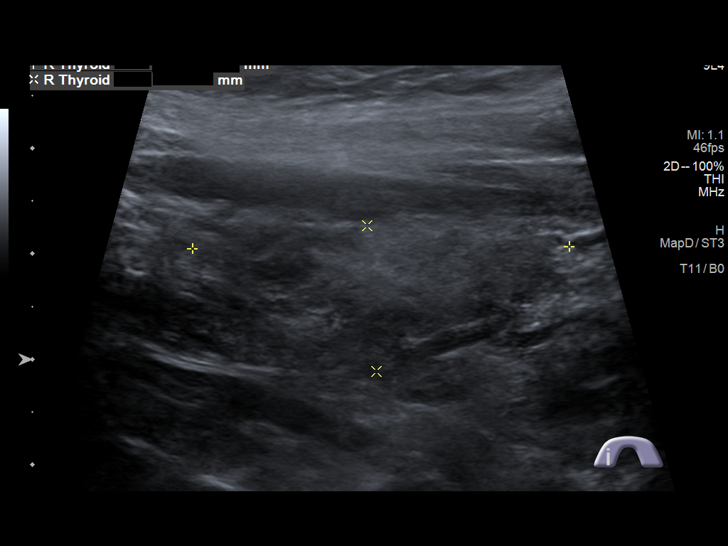
[im 3/34]
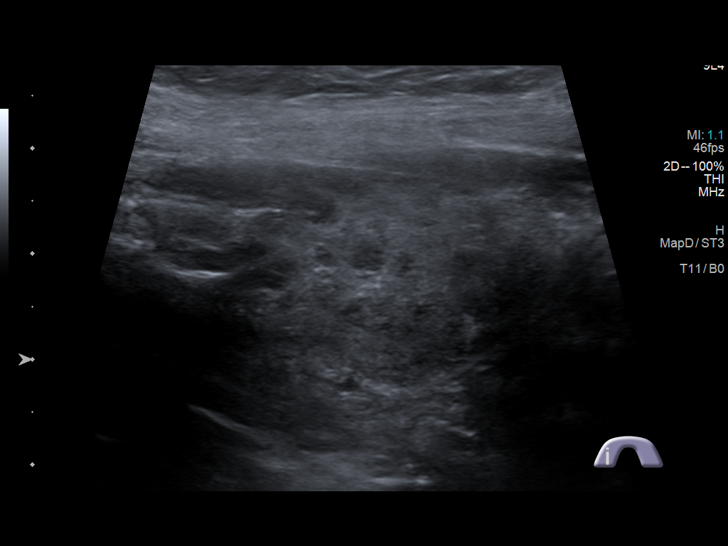
[im 6/34]
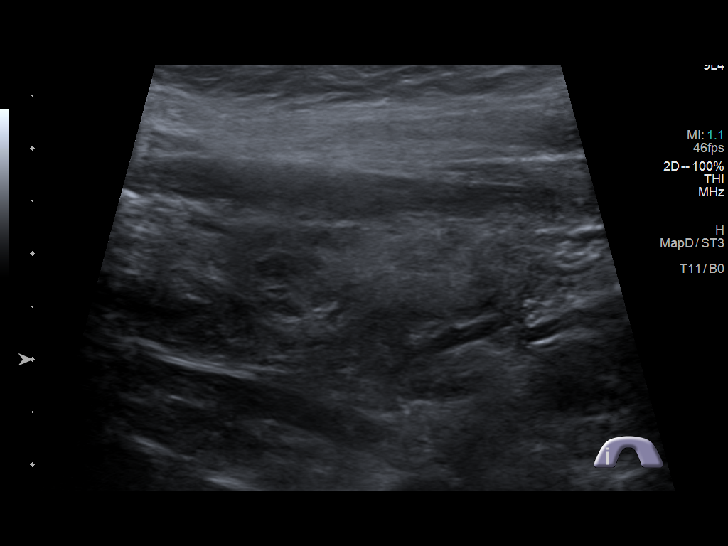
[im 9/34]
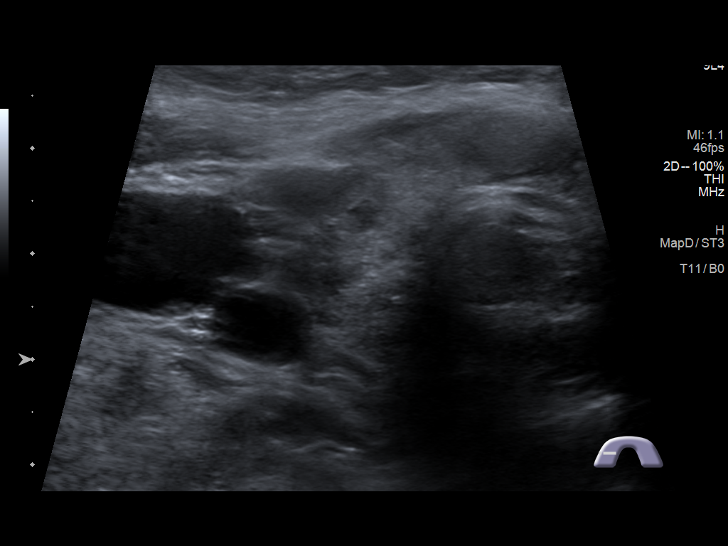
[im 12/34]
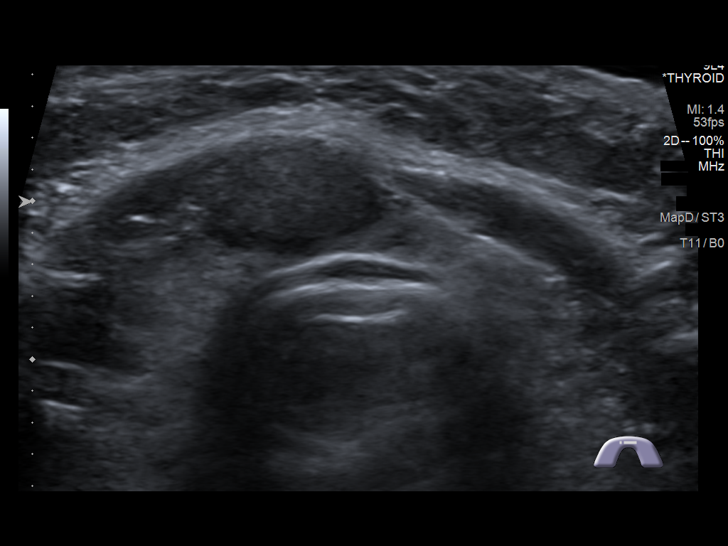
[im 14/34]
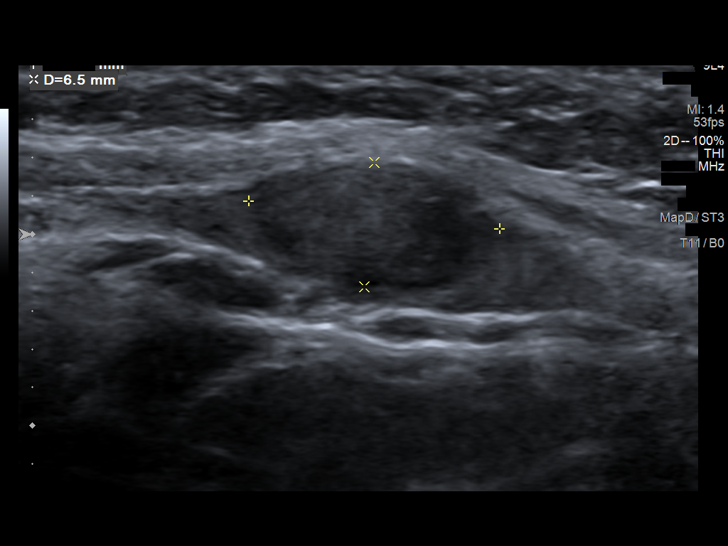
[im 17/34]
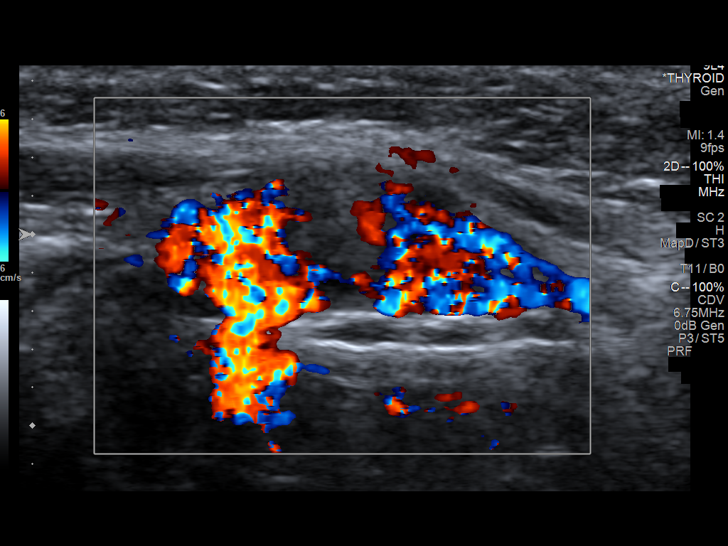
[im 20/34]
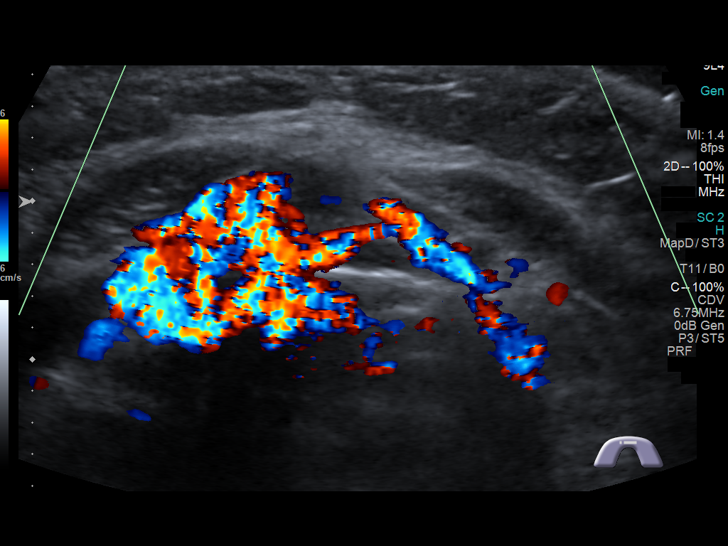
[im 23/34]
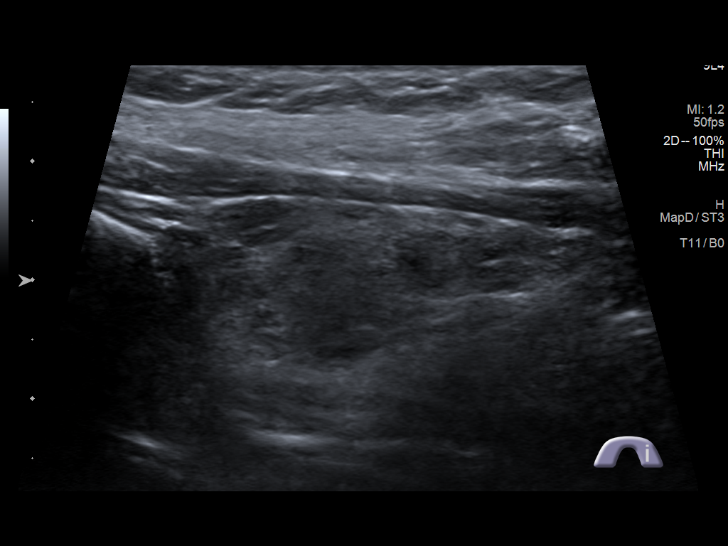
[im 25/34]
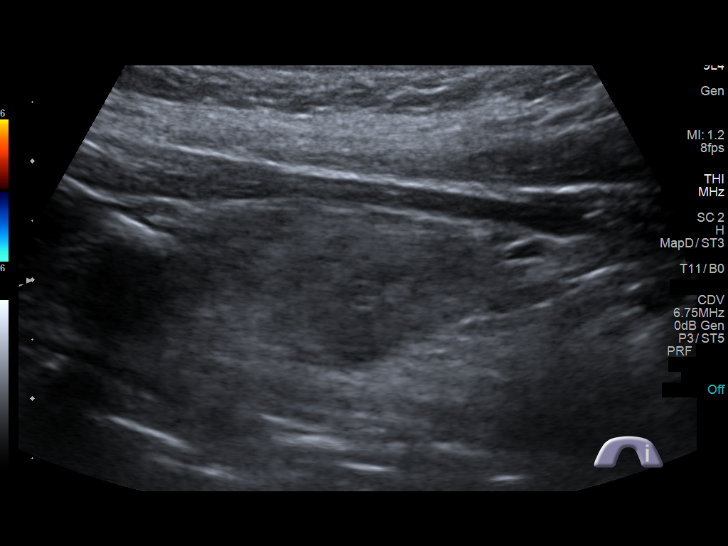
[im 28/34]
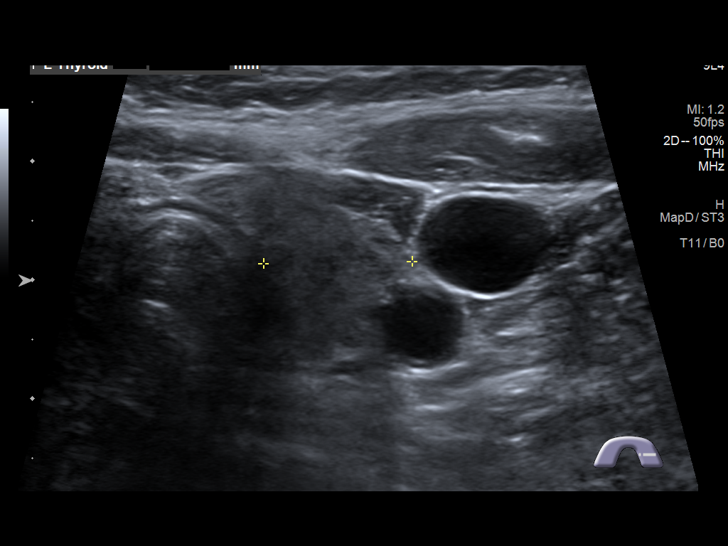
[im 31/34]
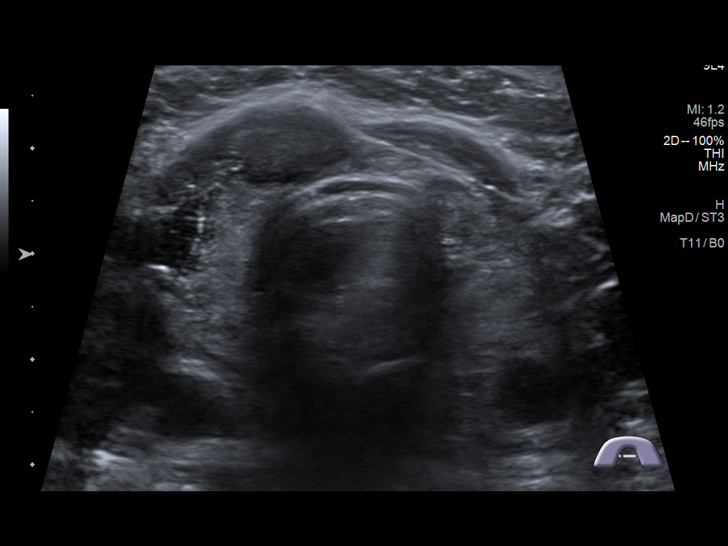
[im 34/34]
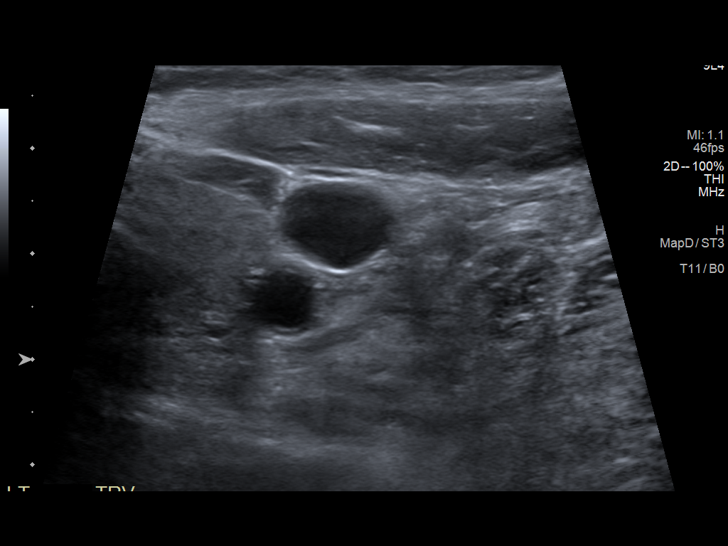

[13 of 25 positions shown; findings below may reference images not displayed]

FINDINGS: Parenchymal Echotexture: Markedly heterogenous

Isthmus:

Right lobe: 3.6 x 1.4 x

Left lobe: 3.3 x 1.3 x

_________________________________________________________

Estimated total number of nodules >/= 1 cm: 1

Number of spongiform nodules >/=  2 cm not described below (TR1): 0

Number of mixed cystic and solid nodules >/= 1.5 cm not described
below (TR2): 0

_________________________________________________________

Nodule # 1:

Prior biopsy: No

Location: Isthmus; Mid

Maximum size: 1.2 cm; Other 2 dimensions: 0.7 x 1.1 cm, previously,
1.2 x 0.6 x 1.1 cm

Composition: solid/almost completely solid (2)

Echogenicity: hypoechoic (2)

Shape: not taller-than-wide (0)

Margins: ill-defined (0)

Echogenic foci: none (0)

ACR TI-RADS total points: 4.

ACR TI-RADS risk category:  TR4 (4-6 points).

Significant change in size (>/= 20% in two dimensions and minimal
increase of 2 mm): No

Change in features: No

Change in ACR TI-RADS risk category: No

ACR TI-RADS recommendations:

*Given size (>/= 1 - 1.4 cm) and appearance, a follow-up ultrasound
in 1 year should be considered based on TI-RADS criteria.

_________________________________________________________
IMPRESSION: Stable 1.2 cm TI-RADS category 4 nodule in the thyroid isthmus. This
lesion continues to meet criteria for annual follow-up until 5 year
stability has been met. Recommend follow-up ultrasound in July 2021.

The above is in keeping with the ACR TI-RADS recommendations - [HOSPITAL] 2738;[DATE].

## 2021-09-10 ENCOUNTER — Other Ambulatory Visit: Payer: Self-pay | Admitting: Physician Assistant

## 2021-09-10 DIAGNOSIS — M797 Fibromyalgia: Secondary | ICD-10-CM

## 2021-09-16 ENCOUNTER — Other Ambulatory Visit: Payer: Self-pay

## 2021-09-16 DIAGNOSIS — J3089 Other allergic rhinitis: Secondary | ICD-10-CM

## 2021-09-16 MED ORDER — MONTELUKAST SODIUM 10 MG PO TABS: 10.0000 mg | ORAL_TABLET | Freq: Every day | ORAL | 0 refills | Status: DC

## 2021-10-07 ENCOUNTER — Other Ambulatory Visit: Payer: Self-pay | Admitting: Medical-Surgical

## 2021-10-27 ENCOUNTER — Other Ambulatory Visit: Payer: Self-pay

## 2021-10-27 ENCOUNTER — Telehealth: Payer: Self-pay

## 2021-10-27 ENCOUNTER — Ambulatory Visit (INDEPENDENT_AMBULATORY_CARE_PROVIDER_SITE_OTHER): Payer: BC Managed Care – PPO | Admitting: Physician Assistant

## 2021-10-27 ENCOUNTER — Ambulatory Visit (INDEPENDENT_AMBULATORY_CARE_PROVIDER_SITE_OTHER): Payer: BC Managed Care – PPO

## 2021-10-27 ENCOUNTER — Encounter: Payer: Self-pay | Admitting: Physician Assistant

## 2021-10-27 VITALS — BP 141/75 | HR 80 | Temp 97.9°F | Ht 64.0 in | Wt 188.0 lb

## 2021-10-27 DIAGNOSIS — G4700 Insomnia, unspecified: Secondary | ICD-10-CM | POA: Diagnosis not present

## 2021-10-27 DIAGNOSIS — E042 Nontoxic multinodular goiter: Secondary | ICD-10-CM

## 2021-10-27 DIAGNOSIS — M5030 Other cervical disc degeneration, unspecified cervical region: Secondary | ICD-10-CM

## 2021-10-27 DIAGNOSIS — M5136 Other intervertebral disc degeneration, lumbar region: Secondary | ICD-10-CM | POA: Diagnosis not present

## 2021-10-27 DIAGNOSIS — G8929 Other chronic pain: Secondary | ICD-10-CM

## 2021-10-27 DIAGNOSIS — M797 Fibromyalgia: Secondary | ICD-10-CM

## 2021-10-27 DIAGNOSIS — M545 Low back pain, unspecified: Secondary | ICD-10-CM

## 2021-10-27 DIAGNOSIS — E039 Hypothyroidism, unspecified: Secondary | ICD-10-CM | POA: Diagnosis not present

## 2021-10-27 MED ORDER — TRAZODONE HCL 50 MG PO TABS: 25.0000 mg | ORAL_TABLET | Freq: Every evening | ORAL | 3 refills | Status: DC | PRN

## 2021-10-27 MED ORDER — GABAPENTIN 800 MG PO TABS: 800.0000 mg | ORAL_TABLET | Freq: Three times a day (TID) | ORAL | 3 refills | Status: DC

## 2021-10-27 MED ORDER — TRAMADOL HCL 50 MG PO TABS
50.0000 mg | ORAL_TABLET | Freq: Two times a day (BID) | ORAL | 0 refills | Status: DC | PRN
Start: 2021-10-27 — End: 2021-11-15

## 2021-10-27 MED ORDER — KETOROLAC TROMETHAMINE 60 MG/2ML IM SOLN
60.0000 mg | Freq: Once | INTRAMUSCULAR | Status: AC
  Administered 2021-10-27: 60 mg via INTRAMUSCULAR

## 2021-10-27 NOTE — Telephone Encounter (Signed)
Medication: traMADol (ULTRAM) 50 MG tablet Prior authorization submitted via CoverMyMeds on 10/27/2021 [place] submission pending

## 2021-10-27 NOTE — Progress Notes (Signed)
Subjective:    Patient ID: Melinda Browning, female    DOB: Aug 30, 1966, 55 y.o.   MRN: [id number]  HPI Pt is a 55 yo obese female with known cervical and lumbar DDD and chronic myofascial pain who presents to the clinic with upper back and low back pain. She rates 7/10 in upper back and 6/10 in lower back. No new injuries. Uses flexeril. Out of gabapentin. On cymbalta.   Last MRI of lumbar.   IMPRESSION: L4-5: Disc desiccation with annular fissure and annular bulging. Mild facet osteoarthritis. No compressive stenosis.   Needs follow up on thyroid nodules and hypothyroidism.   Needs refills on trazodone for sleep.   .. Active Ambulatory Problems    Diagnosis Date Noted   GERD (gastroesophageal reflux disease) 03/22/2018   Hypothyroidism 03/22/2018   Fibromyalgia 03/22/2018   Depression, major, single episode, complete remission (HCC) 03/22/2018   Class 1 obesity due to excess calories with serious comorbidity and body mass index (BMI) of 34.0 to 34.9 in adult 03/22/2018   No energy 03/25/2018   Thyroid nodule 08/03/2018   Vitamin D deficiency 08/06/2018   History of C5-C6 cervical ACDF 01/08/2019   Lumbar degenerative disc disease 01/17/2019   SOB (shortness of breath) 07/02/2019   Cough 07/02/2019   Abnormal computed tomography angiography (CTA) 07/02/2019   Low serum vitamin B12 08/27/2019   Pain in joint involving multiple sites 08/27/2019   Elevated blood pressure reading 09/27/2019   B12 deficiency 09/27/2019   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   Rash and other nonspecific skin eruption 04/28/2020   Mild persistent asthma 06/17/2020   Allergic rhinitis with probable nonallergic component 06/17/2020   Adverse food reaction 06/17/2020   Bloating 06/19/2020   Intertrigo 06/19/2020   Hashimoto's thyroiditis 06/24/2020   Medication side effect 08/03/2020   Bilateral swelling of feet and ankles 09/15/2020   Acute nonintractable headache 09/15/2020   Golfers elbow of left  upper extremity 10/23/2020   Bilateral carpal tunnel syndrome 10/28/2020   Other atopic dermatitis 05/11/2021   Resolved Ambulatory Problems    Diagnosis Date Noted   Acute pain of left shoulder 01/08/2019   History of fusion of cervical spine 01/08/2019   Neck pain 01/09/2019   Numbness and tingling of left hand 10/23/2020   Numbness and tingling of right hand 10/23/2020   Past Medical History:  Diagnosis Date   DDD (degenerative disc disease), lumbar    Depression     Review of Systems See HPI.     Objective:   Physical Exam Vitals reviewed.  Constitutional:      Appearance: Normal appearance. She is obese.  Cardiovascular:     Rate and Rhythm: Normal rate and regular rhythm.     Pulses: Normal pulses.  Pulmonary:     Effort: Pulmonary effort is normal.  Musculoskeletal:     Comments: Lots of muscle tightness and stiffness Negative straight leg test, bilaterally.  Paraspinal tenderness all down the spine  5/5 upper and lower ext strength.   Neurological:     Mental Status: She is alert and oriented to person, place, and time.          Assessment & Plan:  Marland KitchenMarland KitchenEmri was seen today for back pain.  Diagnoses and all orders for this visit:  DDD (degenerative disc disease), lumbar -     traMADol (ULTRAM) 50 MG tablet; Take 1 tablet (50 mg total) by mouth every 12 (twelve) hours as needed. -     COMPLETE METABOLIC PANEL WITH GFR -  ketorolac (TORADOL) injection 60 mg -     Ambulatory referral to Orthopedic Surgery  Fibromyalgia -     gabapentin (NEURONTIN) 800 MG tablet; Take 1 tablet (800 mg total) by mouth 3 (three) times daily. -     traMADol (ULTRAM) 50 MG tablet; Take 1 tablet (50 mg total) by mouth every 12 (twelve) hours as needed. -     COMPLETE METABOLIC PANEL WITH GFR -     ketorolac (TORADOL) injection 60 mg -     Ambulatory referral to Orthopedic Surgery  Acquired hypothyroidism -     COMPLETE METABOLIC PANEL WITH GFR -     TSH -     ketorolac  (TORADOL) injection 60 mg  Insomnia, unspecified type -     COMPLETE METABOLIC PANEL WITH GFR -     traZODone (DESYREL) 50 MG tablet; Take 0.5-1 tablets (25-50 mg total) by mouth at bedtime as needed for sleep. -     ketorolac (TORADOL) injection 60 mg  Multiple thyroid nodules -     US THYROID; Future  DDD (degenerative disc disease), cervical -     traMADol (ULTRAM) 50 MG tablet; Take 1 tablet (50 mg total) by mouth every 12 (twelve) hours as needed. -     Ambulatory referral to Orthopedic Surgery  Chronic bilateral low back pain without sciatica -     traMADol (ULTRAM) 50 MG tablet; Take 1 tablet (50 mg total) by mouth every 12 (twelve) hours as needed.  Refilled medications.  Thyroid u/s ordered.   Tramadol for pain as needed.  Referral made for [facility].  Continue flexeril, cymbalta, gabapentin, and exercises.

## 2021-10-27 NOTE — Patient Instructions (Signed)
Will make appt for neck ultrasound.  Get labs.  Will make referral for orthopedic specialist.

## 2021-10-28 LAB — COMPLETE METABOLIC PANEL WITH GFR
AG Ratio: 1.6 (calc) (ref 1.0–2.5)
ALT: 27 U/L (ref 6–29)
AST: 20 U/L (ref 10–35)
Albumin: 4.4 g/dL (ref 3.6–5.1)
Alkaline phosphatase (APISO): 115 U/L (ref 37–153)
BUN: 13 mg/dL (ref 7–25)
CO2: 28 mmol/L (ref 20–32)
Calcium: 9.6 mg/dL (ref 8.6–10.4)
Chloride: 105 mmol/L (ref 98–110)
Creat: 0.73 mg/dL (ref 0.50–1.03)
Globulin: 2.8 g/dL (calc) (ref 1.9–3.7)
Glucose, Bld: 93 mg/dL (ref 65–99)
Potassium: 4.5 mmol/L (ref 3.5–5.3)
Sodium: 140 mmol/L (ref 135–146)
Total Bilirubin: 0.3 mg/dL (ref 0.2–1.2)
Total Protein: 7.2 g/dL (ref 6.1–8.1)
eGFR: 98 mL/min/{1.73_m2} (ref >=60)

## 2021-10-28 LAB — TSH: TSH: <0.01 mIU/L — ABNORMAL LOW

## 2021-11-01 ENCOUNTER — Other Ambulatory Visit: Payer: Self-pay

## 2021-11-01 ENCOUNTER — Encounter: Payer: Self-pay | Admitting: Physician Assistant

## 2021-11-01 DIAGNOSIS — E039 Hypothyroidism, unspecified: Secondary | ICD-10-CM

## 2021-11-01 NOTE — Progress Notes (Signed)
TSH is too low meaning too much thyroid hormone. We need to decrease your synthroid. You are taking both synthroid and cytomel correct?   Please add Free T4 and Free T3 to labs.

## 2021-11-01 NOTE — Progress Notes (Signed)
Stable thyroid nodule. Follow up one year.  You have some reactive cervical adenopathy, bilateral.

## 2021-11-03 ENCOUNTER — Telehealth: Payer: Self-pay

## 2021-11-03 NOTE — Telephone Encounter (Signed)
[facility] pharmacy left a vm msg requesting a specific diagnosis code for tramadol. Pls advise, thanks.

## 2021-11-04 NOTE — Telephone Encounter (Signed)
Medication: traMADol (ULTRAM) 50 MG tablet Prior authorization determination received Medication has been approved Approval dates: 10/27/2021-04/26/2022  Patient aware via: MyChart Pharmacy aware: Yes Provider aware via this encounter

## 2021-11-05 NOTE — Telephone Encounter (Signed)
Task completed. Pharmacist Carollee Herter) informed of specific diagnosis code for tramadol rx.

## 2021-11-10 ENCOUNTER — Encounter: Payer: Self-pay | Admitting: Physician Assistant

## 2021-11-10 DIAGNOSIS — M5030 Other cervical disc degeneration, unspecified cervical region: Secondary | ICD-10-CM

## 2021-11-10 DIAGNOSIS — M797 Fibromyalgia: Secondary | ICD-10-CM

## 2021-11-10 DIAGNOSIS — M5136 Other intervertebral disc degeneration, lumbar region: Secondary | ICD-10-CM

## 2021-11-10 DIAGNOSIS — G8929 Other chronic pain: Secondary | ICD-10-CM

## 2021-11-15 MED ORDER — TRAMADOL HCL 50 MG PO TABS: 50.0000 mg | ORAL_TABLET | Freq: Two times a day (BID) | ORAL | 0 refills | Status: AC | PRN

## 2021-11-15 MED ORDER — FAMOTIDINE 20 MG PO TABS: 20.0000 mg | ORAL_TABLET | Freq: Two times a day (BID) | ORAL | 3 refills | Status: AC

## 2021-11-17 ENCOUNTER — Encounter: Payer: Self-pay | Admitting: Physician Assistant

## 2021-11-17 ENCOUNTER — Other Ambulatory Visit: Payer: Self-pay | Admitting: Physician Assistant

## 2021-11-17 MED ORDER — LOSARTAN POTASSIUM 25 MG PO TABS: 25.0000 mg | ORAL_TABLET | Freq: Every day | ORAL | 0 refills | Status: DC

## 2021-11-17 NOTE — Progress Notes (Signed)
Elevated BP readings. Added cozaar. Recheck BP in 2 week.

## 2021-11-18 ENCOUNTER — Other Ambulatory Visit: Payer: Self-pay | Admitting: *Deleted

## 2021-11-18 MED ORDER — LOSARTAN POTASSIUM 25 MG PO TABS: 25.0000 mg | ORAL_TABLET | Freq: Every day | ORAL | 0 refills | Status: DC

## 2021-11-19 NOTE — Telephone Encounter (Signed)
I sent referral to [facility]

## 2021-11-23 ENCOUNTER — Encounter: Payer: Self-pay | Admitting: Physician Assistant

## 2021-11-25 ENCOUNTER — Encounter: Payer: Self-pay | Admitting: Physician Assistant

## 2021-11-26 MED ORDER — HYDROCHLOROTHIAZIDE 12.5 MG PO TABS: 12.5000 mg | ORAL_TABLET | Freq: Every day | ORAL | 0 refills | Status: DC

## 2021-12-24 ENCOUNTER — Other Ambulatory Visit: Payer: Self-pay

## 2021-12-24 DIAGNOSIS — E039 Hypothyroidism, unspecified: Secondary | ICD-10-CM

## 2021-12-24 MED ORDER — SYNTHROID 100 MCG PO TABS: 100.0000 ug | ORAL_TABLET | Freq: Every day | ORAL | 0 refills | Status: DC

## 2022-01-26 ENCOUNTER — Other Ambulatory Visit: Payer: Self-pay | Admitting: Physician Assistant

## 2022-01-26 DIAGNOSIS — M797 Fibromyalgia: Secondary | ICD-10-CM

## 2022-01-28 ENCOUNTER — Ambulatory Visit: Payer: BC Managed Care – PPO | Admitting: Physician Assistant

## 2022-02-25 ENCOUNTER — Telehealth: Payer: Self-pay | Admitting: General Practice

## 2022-02-25 NOTE — Telephone Encounter (Signed)
Transition Care Management Unsuccessful Follow-up Telephone Call ? ?Date of discharge and from where:  02/23/22 from [facility] ? ?Attempts:  1st Attempt ? ?Reason for unsuccessful TCM follow-up call:  Voice mail full ? ?  ?

## 2022-02-28 NOTE — Telephone Encounter (Signed)
Transition Care Management Unsuccessful Follow-up Telephone Call ? ?Date of discharge and from where:  02/23/22 from [facility] ? ?Attempts:  2nd Attempt ? ?Reason for unsuccessful TCM follow-up call:  Voice mail full ? ?  ?

## 2022-03-02 NOTE — Telephone Encounter (Signed)
Transition Care Management Unsuccessful Follow-up Telephone Call ? ?Date of discharge and from where:  02/23/22 from [facility] center ? ?Attempts:  3rd Attempt ? ?Reason for unsuccessful TCM follow-up call:  Voice mail full ? ?  ?

## 2022-03-11 ENCOUNTER — Other Ambulatory Visit: Payer: Self-pay | Admitting: Physician Assistant

## 2022-03-11 DIAGNOSIS — E039 Hypothyroidism, unspecified: Secondary | ICD-10-CM

## 2022-04-08 ENCOUNTER — Ambulatory Visit: Payer: BC Managed Care – PPO | Admitting: Physician Assistant

## 2022-05-06 ENCOUNTER — Encounter: Payer: Self-pay | Admitting: Physician Assistant

## 2022-05-06 ENCOUNTER — Ambulatory Visit: Payer: BC Managed Care – PPO | Admitting: Physician Assistant

## 2022-05-06 VITALS — BP 155/84 | HR 98 | Ht 64.0 in | Wt 189.0 lb

## 2022-05-06 DIAGNOSIS — E538 Deficiency of other specified B group vitamins: Secondary | ICD-10-CM

## 2022-05-06 DIAGNOSIS — Z131 Encounter for screening for diabetes mellitus: Secondary | ICD-10-CM

## 2022-05-06 DIAGNOSIS — G4700 Insomnia, unspecified: Secondary | ICD-10-CM

## 2022-05-06 DIAGNOSIS — E559 Vitamin D deficiency, unspecified: Secondary | ICD-10-CM | POA: Diagnosis not present

## 2022-05-06 DIAGNOSIS — E039 Hypothyroidism, unspecified: Secondary | ICD-10-CM | POA: Diagnosis not present

## 2022-05-06 DIAGNOSIS — Z79899 Other long term (current) drug therapy: Secondary | ICD-10-CM

## 2022-05-06 DIAGNOSIS — J4541 Moderate persistent asthma with (acute) exacerbation: Secondary | ICD-10-CM

## 2022-05-06 DIAGNOSIS — J302 Other seasonal allergic rhinitis: Secondary | ICD-10-CM

## 2022-05-06 DIAGNOSIS — M797 Fibromyalgia: Secondary | ICD-10-CM

## 2022-05-06 DIAGNOSIS — Z13220 Encounter for screening for lipoid disorders: Secondary | ICD-10-CM

## 2022-05-06 DIAGNOSIS — M5136 Other intervertebral disc degeneration, lumbar region: Secondary | ICD-10-CM

## 2022-05-06 DIAGNOSIS — J3089 Other allergic rhinitis: Secondary | ICD-10-CM

## 2022-05-06 DIAGNOSIS — I10 Essential (primary) hypertension: Secondary | ICD-10-CM

## 2022-05-06 MED ORDER — DULOXETINE HCL 40 MG PO CPEP: 1.0000 | ORAL_CAPSULE | Freq: Every day | ORAL | 3 refills | Status: DC

## 2022-05-06 MED ORDER — FLUTICASONE PROPIONATE HFA 110 MCG/ACT IN AERO: INHALATION_SPRAY | RESPIRATORY_TRACT | 3 refills | Status: DC

## 2022-05-06 MED ORDER — FLUTICASONE PROPIONATE 50 MCG/ACT NA SUSP: NASAL | 5 refills | Status: AC

## 2022-05-06 MED ORDER — HYDROCHLOROTHIAZIDE 12.5 MG PO TABS: 12.5000 mg | ORAL_TABLET | Freq: Every day | ORAL | 3 refills | Status: DC

## 2022-05-06 MED ORDER — MONTELUKAST SODIUM 10 MG PO TABS: 10.0000 mg | ORAL_TABLET | Freq: Every day | ORAL | 3 refills | Status: DC

## 2022-05-06 MED ORDER — LEVOCETIRIZINE DIHYDROCHLORIDE 5 MG PO TABS: 5.0000 mg | ORAL_TABLET | Freq: Two times a day (BID) | ORAL | 3 refills | Status: DC

## 2022-05-06 MED ORDER — TRAZODONE HCL 50 MG PO TABS: 25.0000 mg | ORAL_TABLET | Freq: Every evening | ORAL | 3 refills | Status: DC | PRN

## 2022-05-06 MED ORDER — LIOTHYRONINE SODIUM 5 MCG PO TABS: 5.0000 ug | ORAL_TABLET | Freq: Every day | ORAL | 3 refills | Status: DC

## 2022-05-06 MED ORDER — SYNTHROID 100 MCG PO TABS: 100.0000 ug | ORAL_TABLET | Freq: Every day | ORAL | 3 refills | Status: DC

## 2022-05-06 MED ORDER — GABAPENTIN 800 MG PO TABS: 800.0000 mg | ORAL_TABLET | Freq: Three times a day (TID) | ORAL | 3 refills | Status: DC

## 2022-05-06 NOTE — Progress Notes (Signed)
Established Patient Office Visit  Subjective   Patient ID: Melinda Browning, female    DOB: 06/03/1966  Age: 56 y.o. MRN: [id number]  Chief Complaint  Patient presents with   Follow-up    HPI Pt is a 56 yo obese female with asthma, GERD, hypothyroidism, allergies, chronic pain with DDD and fibromyalgia who presents to the clinic for follow up and medication refills.   She is doing pretty good. She is quitting her job so that she does not have to do so much lifting. She feels like this will help with pain. No changes to medication. No CP, palpitations, headaches. Her myalgias [place].   Allergies wax and wane.    ROS See HPI.    Objective:     BP (!) 155/84   Pulse 98   Ht 5\' 4"  (1.626 m)   Wt 189 lb (85.7 kg)   SpO2 98%   BMI 32.44 kg/m  BP Readings from Last 3 Encounters:  05/06/22 (!) 155/84  10/27/21 (!) 141/75  05/11/21 124/78   Wt Readings from Last 3 Encounters:  05/06/22 189 lb (85.7 kg)  10/27/21 188 lb (85.3 kg)  05/11/21 195 lb (88.5 kg)      Physical Exam Vitals reviewed.  Constitutional:      Appearance: Normal appearance. She is obese.  HENT:     Head: Normocephalic.  Neck:     Vascular: No carotid bruit.  Cardiovascular:     Rate and Rhythm: Normal rate and regular rhythm.     Pulses: Normal pulses.  Pulmonary:     Effort: Pulmonary effort is normal.     Breath sounds: Normal breath sounds.  Musculoskeletal:     Cervical back: Normal range of motion and neck supple. No tenderness.     Right lower leg: No edema.     Left lower leg: No edema.  Lymphadenopathy:     Cervical: No cervical adenopathy.  Neurological:     General: No focal deficit present.     Mental Status: She is alert and oriented to person, place, and time.  Psychiatric:        Mood and Affect: Mood normal.     ..    05/06/2022    3:31 PM 04/15/2020   10:09 AM 09/23/2019   11:44 AM 07/24/2019    1:36 PM 01/08/2019   10:55 AM   Depression screen PHQ 2/9  Decreased Interest 0 0 0 2 0  Down, Depressed, Hopeless 0 0 0 0 0  PHQ - 2 Score 0 0 0 2 0  Altered sleeping 0 1 1 3 3   Tired, decreased energy 0 1 0 1 3  Change in appetite 0 3 1 1  0  Feeling bad or failure about yourself  0 0 0 0 0  Trouble concentrating 0 0 1 2 0  Moving slowly or fidgety/restless 0 0 0 1 0  Suicidal thoughts 0 0 0 0 0  PHQ-9 Score 0 5 3 10 6   Difficult doing work/chores  Somewhat difficult Not difficult at all Somewhat difficult Not difficult at all   ..    05/06/2022    3:32 PM 04/15/2020   10:10 AM 09/23/2019   11:45 AM 07/24/2019    1:36 PM  GAD 7 : Generalized Anxiety Score  Nervous, Anxious, on Edge 1 0 0 1  Control/stop worrying 1 0 0 0  Worry too much - different things 0 0 0  0  Trouble relaxing 1 1 0 1  Restless 3 1 0 1  Easily annoyed or irritable 0 0 0 1  Afraid - awful might happen 0 0 0 0  Total GAD 7 Score 6 2 0 4  Anxiety Difficulty Very difficult Somewhat difficult Not difficult at all Somewhat difficult         Assessment & Plan:  Marland KitchenMarland KitchenDenece was seen today for follow-up.  Diagnoses and all orders for this visit:  Acquired hypothyroidism -     TSH -     T4, free -     T3, free -     SYNTHROID 100 MCG tablet; Take 1 tablet (100 mcg total) by mouth daily before breakfast. -     liothyronine (CYTOMEL) 5 MCG tablet; Take 1 tablet (5 mcg total) by mouth daily.  Vitamin D deficiency -     Vitamin D (25 hydroxy)  B12 deficiency -     Vitamin B12  Diabetes mellitus screening -     COMPLETE METABOLIC PANEL WITH GFR  Lipid screening -     Lipid Panel w/reflex Direct LDL  Medication management -     TSH -     Lipid Panel w/reflex Direct LDL -     COMPLETE METABOLIC PANEL WITH GFR -     CBC with Differential/Platelet -     T4, free -     T3, free -     Vitamin B12 -     Vitamin D (25 hydroxy)  Allergic rhinitis with probable nonallergic component -     montelukast (SINGULAIR) 10 MG tablet; Take 1  tablet (10 mg total) by mouth at bedtime. -     fluticasone (FLONASE) 50 MCG/ACT nasal spray; 2 sprays per nostril once daily as needed for stuffy nose.  Fibromyalgia -     gabapentin (NEURONTIN) 800 MG tablet; Take 1 tablet (800 mg total) by mouth 3 (three) times daily. -     DULoxetine HCl 40 MG CPEP; Take 1 capsule by mouth daily.  Insomnia, unspecified type -     traZODone (DESYREL) 50 MG tablet; Take 0.5-1 tablets (25-50 mg total) by mouth at bedtime as needed for sleep.  Seasonal allergies -     levocetirizine (XYZAL) 5 MG tablet; Take 1 tablet (5 mg total) by mouth in the morning and at bedtime.  Lumbar degenerative disc disease -     cyclobenzaprine (FLEXERIL) 10 MG tablet; Take 0.5-1 tablets (5-10 mg total) by mouth 3 (three) times daily as needed for muscle spasms. Caution: can cause drowsiness  Primary hypertension -     hydrochlorothiazide (HYDRODIURIL) 12.5 MG tablet; Take 1 tablet (12.5 mg total) by mouth daily.  Moderate persistent reactive airway disease with acute exacerbation -     fluticasone (FLOVENT HFA) 110 MCG/ACT inhaler; 2 puffs twice daily with spacer to prevent coughing or wheezing.   Refilled medications Labs ordered for management Discussed to keep walking daily and staying active BP not to goal Discussed low salt diet Start HCTZ Follow up in 4 weeks.    Return in about 4 weeks (around 06/03/2022).    Tandy Gaw, PA-C

## 2022-05-07 LAB — T4, FREE: Free T4: 1.3 ng/dL (ref 0.8–1.8)

## 2022-05-07 LAB — CBC WITH DIFFERENTIAL/PLATELET
Absolute Monocytes: 641 cells/uL (ref 200–950)
Basophils Absolute: 50 cells/uL (ref 0–200)
Basophils Relative: 0.7 %
Eosinophils Absolute: 79 cells/uL (ref 15–500)
Eosinophils Relative: 1.1 %
HCT: 40.3 % (ref 35.0–45.0)
Hemoglobin: 13.8 g/dL (ref 11.7–15.5)
Lymphs Abs: 3139 cells/uL (ref 850–3900)
MCH: 30.9 pg (ref 27.0–33.0)
MCHC: 34.2 g/dL (ref 32.0–36.0)
MCV: 90.4 fL (ref 80.0–100.0)
MPV: 10.2 fL (ref 7.5–12.5)
Monocytes Relative: 8.9 %
Neutro Abs: 3290 cells/uL (ref 1500–7800)
Neutrophils Relative %: 45.7 %
Platelets: 300 10*3/uL (ref 140–400)
RBC: 4.46 10*6/uL (ref 3.80–5.10)
RDW: 12.6 % (ref 11.0–15.0)
Total Lymphocyte: 43.6 %
WBC: 7.2 10*3/uL (ref 3.8–10.8)

## 2022-05-07 LAB — COMPLETE METABOLIC PANEL WITH GFR
AG Ratio: 1.3 (calc) (ref 1.0–2.5)
ALT: 35 U/L — ABNORMAL HIGH (ref 6–29)
AST: 29 U/L (ref 10–35)
Albumin: 4.3 g/dL (ref 3.6–5.1)
Alkaline phosphatase (APISO): 116 U/L (ref 37–153)
BUN: 13 mg/dL (ref 7–25)
CO2: 24 mmol/L (ref 20–32)
Calcium: 9.5 mg/dL (ref 8.6–10.4)
Chloride: 105 mmol/L (ref 98–110)
Creat: 0.64 mg/dL (ref 0.50–1.03)
Globulin: 3.2 g/dL (calc) (ref 1.9–3.7)
Glucose, Bld: 96 mg/dL (ref 65–99)
Potassium: 4.3 mmol/L (ref 3.5–5.3)
Sodium: 140 mmol/L (ref 135–146)
Total Bilirubin: 0.3 mg/dL (ref 0.2–1.2)
Total Protein: 7.5 g/dL (ref 6.1–8.1)
eGFR: 104 mL/min/{1.73_m2} (ref >=60)

## 2022-05-07 LAB — VITAMIN B12: Vitamin B-12: 1022 pg/mL (ref 200–1100)

## 2022-05-07 LAB — TSH: TSH: 0.05 mIU/L — ABNORMAL LOW

## 2022-05-07 LAB — VITAMIN D 25 HYDROXY (VIT D DEFICIENCY, FRACTURES): Vit D, 25-Hydroxy: 36 ng/mL (ref 30–100)

## 2022-05-07 LAB — T3, FREE: T3, Free: 3.1 pg/mL (ref 2.3–4.2)

## 2022-05-10 NOTE — Progress Notes (Signed)
Vitamin D better in normal range take at least 1000 to 2000 units a day.  Free thyroid levels look great. TSH showing a little low leaning towards over supplementation. If you feel like thyroid symptoms are good. Stay on same dose for now. If not I would skip one thyroid dose a week and recheck labs in 2 months.

## 2022-05-13 ENCOUNTER — Encounter: Payer: Self-pay | Admitting: Physician Assistant

## 2022-05-13 DIAGNOSIS — J302 Other seasonal allergic rhinitis: Secondary | ICD-10-CM | POA: Insufficient documentation

## 2022-05-13 DIAGNOSIS — G4700 Insomnia, unspecified: Secondary | ICD-10-CM | POA: Insufficient documentation

## 2022-05-13 MED ORDER — CYCLOBENZAPRINE HCL 10 MG PO TABS: 5.0000 mg | ORAL_TABLET | Freq: Three times a day (TID) | ORAL | 1 refills | Status: DC | PRN

## 2022-08-10 ENCOUNTER — Other Ambulatory Visit: Payer: Self-pay | Admitting: Physician Assistant

## 2022-08-10 ENCOUNTER — Encounter: Payer: Self-pay | Admitting: Physician Assistant

## 2022-08-10 MED ORDER — DULOXETINE HCL 30 MG PO CPEP: 30.0000 mg | ORAL_CAPSULE | Freq: Every day | ORAL | 3 refills | Status: DC

## 2022-08-10 NOTE — Progress Notes (Signed)
For cost decreased cymbalta down to 30mg  daily.

## 2022-08-11 ENCOUNTER — Other Ambulatory Visit: Payer: Self-pay | Admitting: Physician Assistant

## 2022-08-11 DIAGNOSIS — E039 Hypothyroidism, unspecified: Secondary | ICD-10-CM

## 2022-10-31 ENCOUNTER — Encounter: Payer: Self-pay | Admitting: Physician Assistant

## 2022-11-16 ENCOUNTER — Telehealth: Payer: Self-pay | Admitting: General Practice

## 2022-11-16 NOTE — Telephone Encounter (Signed)
Transition Care Management Unsuccessful Follow-up Telephone Call  Date of discharge and from where:  11/15/22 from [facility] center  Attempts:  1st Attempt  Reason for unsuccessful TCM follow-up call:  Left voice message

## 2022-11-17 NOTE — Telephone Encounter (Signed)
Transition Care Management Unsuccessful Follow-up Telephone Call  Date of discharge and from where:  11/15/22 from [facility] medical center  Attempts:  2nd Attempt  Reason for unsuccessful TCM follow-up call:  Left voice message

## 2022-11-21 NOTE — Telephone Encounter (Signed)
Transition Care Management Unsuccessful Follow-up Telephone Call  Date of discharge and from where:  11/15/22 from [facility] medical center  Attempts:  3rd Attempt  Reason for unsuccessful TCM follow-up call:  Left voice message

## 2023-01-16 ENCOUNTER — Telehealth: Payer: Self-pay | Admitting: General Practice

## 2023-01-16 NOTE — Telephone Encounter (Signed)
Transition Care Management Follow-up Telephone Call Date of discharge and from where: 01/14/23 from [facility] How have you been since you were released from the hospital? She is doing ok. She is currently in [place]. She will call back when she comes back to town and she also does not have insurance right now. Any questions or concerns? No  Items Reviewed: Did the pt receive and understand the discharge instructions provided? Yes  Medications obtained and verified? No  Other? No  Any new allergies since your discharge? No  Dietary orders reviewed? Yes Do you have support at home? Yes   Home Care and Equipment/Supplies: Were home health services ordered? no   Functional Questionnaire: (I = Independent and D = Dependent) ADLs: I  Bathing/Dressing- I  Meal Prep- I  Eating- I  Maintaining continence- I  Transferring/Ambulation- I  Managing Meds- I  Follow up appointments reviewed:  PCP Hospital f/u appt confirmed? No   Specialist Hospital f/u appt confirmed? No   Are transportation arrangements needed? No  If their condition worsens, is the pt aware to call PCP or go to the Emergency Dept.? Yes Was the patient provided with contact information for the PCP's office or ED? Yes Was to pt encouraged to call back with questions or concerns? Yes

## 2023-01-27 ENCOUNTER — Encounter: Payer: Self-pay | Admitting: Physician Assistant

## 2023-01-27 ENCOUNTER — Ambulatory Visit: Payer: Self-pay | Admitting: Physician Assistant

## 2023-01-27 VITALS — BP 151/86 | HR 90 | Ht 64.0 in | Wt 191.1 lb

## 2023-01-27 DIAGNOSIS — T7840XD Allergy, unspecified, subsequent encounter: Secondary | ICD-10-CM

## 2023-01-27 DIAGNOSIS — K921 Melena: Secondary | ICD-10-CM

## 2023-01-27 DIAGNOSIS — G4700 Insomnia, unspecified: Secondary | ICD-10-CM

## 2023-01-27 DIAGNOSIS — Z800 Family history of malignant neoplasm of digestive organs: Secondary | ICD-10-CM | POA: Insufficient documentation

## 2023-01-27 DIAGNOSIS — Z789 Other specified health status: Secondary | ICD-10-CM

## 2023-01-27 DIAGNOSIS — Z09 Encounter for follow-up examination after completed treatment for conditions other than malignant neoplasm: Secondary | ICD-10-CM

## 2023-01-27 DIAGNOSIS — K5900 Constipation, unspecified: Secondary | ICD-10-CM

## 2023-01-27 DIAGNOSIS — I10 Essential (primary) hypertension: Secondary | ICD-10-CM

## 2023-01-27 DIAGNOSIS — E039 Hypothyroidism, unspecified: Secondary | ICD-10-CM

## 2023-01-27 MED ORDER — TRAZODONE HCL 50 MG PO TABS: 25.0000 mg | ORAL_TABLET | Freq: Every evening | ORAL | 3 refills | Status: DC | PRN

## 2023-01-27 MED ORDER — LIOTHYRONINE SODIUM 5 MCG PO TABS: 5.0000 ug | ORAL_TABLET | Freq: Every day | ORAL | 3 refills | Status: DC

## 2023-01-27 MED ORDER — SYNTHROID 100 MCG PO TABS: ORAL_TABLET | ORAL | 2 refills | Status: DC

## 2023-01-27 NOTE — Progress Notes (Unsigned)
   Established Patient Office Visit  Subjective   Patient ID: Melinda Browning, female    DOB: [birth year]  Age: 57 y.o. MRN: [id number]  No chief complaint on file.   HPI Allergic reaction consumed foot with gluten and she is allergic shortness of breath within half an hour Given solumedrol, benadryl, pepcid with 5 days of prednisone.  Epi pen  Pain and swelling blood in stool  4 months   Maternal grandmother died of colon cancer  No insurance No mucinex Blood  Once a day floatin    {History (Optional):23778}  ROS    Objective:     There were no vitals taken for this visit. BP Readings from Last 3 Encounters:  05/06/22 (!) 155/84  10/27/21 (!) 141/75  05/11/21 124/78   Wt Readings from Last 3 Encounters:  05/06/22 189 lb (85.7 kg)  10/27/21 188 lb (85.3 kg)  05/11/21 195 lb (88.5 kg)      Physical Exam      Assessment & Plan:     Iran Planas, PA-C

## 2023-01-27 NOTE — Patient Instructions (Addendum)
Care Credit to pay for colonoscopy  Needs colonoscopy- I will place referral for [facility]. Consider calling around and seeing where the best price is  Start HcTZ for blood pressure

## 2023-01-30 ENCOUNTER — Encounter: Payer: Self-pay | Admitting: Physician Assistant

## 2023-01-30 DIAGNOSIS — T7840XA Allergy, unspecified, initial encounter: Secondary | ICD-10-CM | POA: Insufficient documentation

## 2023-01-30 DIAGNOSIS — I10 Essential (primary) hypertension: Secondary | ICD-10-CM | POA: Insufficient documentation

## 2023-01-30 DIAGNOSIS — Z09 Encounter for follow-up examination after completed treatment for conditions other than malignant neoplasm: Secondary | ICD-10-CM | POA: Insufficient documentation

## 2023-01-30 DIAGNOSIS — K5900 Constipation, unspecified: Secondary | ICD-10-CM | POA: Insufficient documentation

## 2023-01-30 MED ORDER — HYDROCHLOROTHIAZIDE 12.5 MG PO TABS: 12.5000 mg | ORAL_TABLET | Freq: Every day | ORAL | 0 refills | Status: DC

## 2023-04-28 ENCOUNTER — Ambulatory Visit: Payer: Self-pay | Admitting: Physician Assistant

## 2023-06-03 ENCOUNTER — Other Ambulatory Visit: Payer: Self-pay | Admitting: Physician Assistant

## 2023-06-03 DIAGNOSIS — I10 Essential (primary) hypertension: Secondary | ICD-10-CM

## 2023-08-26 ENCOUNTER — Other Ambulatory Visit: Payer: Self-pay | Admitting: Physician Assistant

## 2023-08-26 DIAGNOSIS — E039 Hypothyroidism, unspecified: Secondary | ICD-10-CM

## 2023-08-29 ENCOUNTER — Encounter: Payer: Self-pay | Admitting: Physician Assistant

## 2023-08-29 ENCOUNTER — Other Ambulatory Visit: Payer: Self-pay | Admitting: Physician Assistant

## 2023-08-29 DIAGNOSIS — I10 Essential (primary) hypertension: Secondary | ICD-10-CM

## 2023-08-29 DIAGNOSIS — M797 Fibromyalgia: Secondary | ICD-10-CM

## 2023-08-29 MED ORDER — HYDROCHLOROTHIAZIDE 12.5 MG PO TABS
12.5000 mg | ORAL_TABLET | Freq: Every day | ORAL | 0 refills | Status: DC
Start: 2023-08-29 — End: 2023-09-17

## 2023-08-29 MED ORDER — EPINEPHRINE 0.3 MG/0.3ML IJ SOAJ: 0.3000 mg | INTRAMUSCULAR | 1 refills | Status: DC | PRN

## 2023-08-29 MED ORDER — DULOXETINE HCL 40 MG PO CPEP: 1.0000 | ORAL_CAPSULE | Freq: Every day | ORAL | 0 refills | Status: DC

## 2023-09-02 ENCOUNTER — Other Ambulatory Visit: Payer: Self-pay | Admitting: Physician Assistant

## 2023-09-04 ENCOUNTER — Ambulatory Visit: Payer: Self-pay | Admitting: Physician Assistant

## 2023-09-08 ENCOUNTER — Encounter: Payer: Self-pay | Admitting: Physician Assistant

## 2023-09-08 ENCOUNTER — Ambulatory Visit: Payer: Self-pay | Admitting: Physician Assistant

## 2023-09-08 VITALS — BP 135/81 | HR 84 | Ht 64.0 in | Wt 198.0 lb

## 2023-09-08 DIAGNOSIS — G8929 Other chronic pain: Secondary | ICD-10-CM

## 2023-09-08 DIAGNOSIS — M797 Fibromyalgia: Secondary | ICD-10-CM

## 2023-09-08 DIAGNOSIS — I10 Essential (primary) hypertension: Secondary | ICD-10-CM

## 2023-09-08 DIAGNOSIS — E039 Hypothyroidism, unspecified: Secondary | ICD-10-CM

## 2023-09-08 DIAGNOSIS — M5442 Lumbago with sciatica, left side: Secondary | ICD-10-CM

## 2023-09-08 DIAGNOSIS — J309 Allergic rhinitis, unspecified: Secondary | ICD-10-CM

## 2023-09-08 DIAGNOSIS — M51362 Other intervertebral disc degeneration, lumbar region with discogenic back pain and lower extremity pain: Secondary | ICD-10-CM

## 2023-09-08 DIAGNOSIS — M5441 Lumbago with sciatica, right side: Secondary | ICD-10-CM

## 2023-09-08 MED ORDER — CETIRIZINE HCL 10 MG PO TABS
10.0000 mg | ORAL_TABLET | Freq: Every day | ORAL | 4 refills | Status: DC
Start: 2023-09-08 — End: 2024-08-30

## 2023-09-08 MED ORDER — TRAMADOL HCL 50 MG PO TABS: ORAL_TABLET | ORAL | 0 refills | Status: DC

## 2023-09-08 MED ORDER — DULOXETINE HCL 60 MG PO CPEP
60.0000 mg | ORAL_CAPSULE | Freq: Every day | ORAL | 3 refills | Status: DC
Start: 2023-09-08 — End: 2024-08-30

## 2023-09-08 MED ORDER — CELECOXIB 200 MG PO CAPS: 200.0000 mg | ORAL_CAPSULE | Freq: Two times a day (BID) | ORAL | 2 refills | Status: DC

## 2023-09-08 MED ORDER — KETOROLAC TROMETHAMINE 60 MG/2ML IM SOLN
30.0000 mg | Freq: Once | INTRAMUSCULAR | Status: AC
Start: 2023-09-08 — End: 2023-09-08
  Administered 2023-09-08: 30 mg via INTRAMUSCULAR

## 2023-09-08 MED ORDER — METHYLPREDNISOLONE SODIUM SUCC 125 MG IJ SOLR
125.0000 mg | Freq: Once | INTRAMUSCULAR | Status: AC
Start: 2023-09-08 — End: 2023-09-08
  Administered 2023-09-08: 125 mg via INTRAMUSCULAR

## 2023-09-08 NOTE — Patient Instructions (Signed)
Cymbalta 60 Celebrex up to twice a day Get labs Tramadol as needed for breakthrough pain

## 2023-09-08 NOTE — Progress Notes (Signed)
Established Patient Office Visit  Subjective   Patient ID: Melinda Browning, female    DOB: 01/22/1966  Age: 57 y.o. MRN: [id number]  Chief Complaint  Patient presents with   Medical Management of Chronic Issues    Thyroid nerve pain    HPI Pt is a 57 yo female with hypothyroidism, HTN and chronic low back pain who presents to the clinic for medication refills.   Pt is not checking her BP at home. She has been out of her thyroid medication for a few days. She denies any CP, palpitations, headaches or vision changes.   She continues to have low back pain due to DDD. She continues to have radiation of pain down both legs from time to time. No bowel or urinary loss of control, no lower extremity strength changes.   Pt is having some nasal congestion and ear fullness hx of allergies. Using flonase.     Patient Active Problem List   Diagnosis Date Noted   Allergic sinusitis 09/08/2023   Hospital discharge follow-up 01/30/2023   Allergic reaction 01/30/2023   Constipation 01/30/2023   Primary hypertension 01/30/2023   Consumes gluten free diet 01/27/2023   Family history of colon cancer 01/27/2023   Blood in stool 01/27/2023   Insomnia 05/13/2022   Seasonal allergies 05/13/2022   Other atopic dermatitis 05/11/2021   Bilateral carpal tunnel syndrome 10/28/2020   Golfers elbow of left upper extremity 10/23/2020   Bilateral swelling of feet and ankles 09/15/2020   Acute nonintractable headache 09/15/2020   Medication side effect 08/03/2020   Hashimoto's thyroiditis 06/24/2020   Bloating 06/19/2020   Intertrigo 06/19/2020   Mild persistent asthma 06/17/2020   Allergic rhinitis with probable nonallergic component 06/17/2020   Adverse food reaction 06/17/2020   Rash and other nonspecific skin eruption 04/28/2020   Elevated blood pressure reading 09/27/2019   B12 deficiency 09/27/2019   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   Low serum vitamin B12 08/27/2019   Pain in joint involving  multiple sites 08/27/2019   SOB (shortness of breath) 07/02/2019   Cough 07/02/2019   Abnormal computed tomography angiography (CTA) 07/02/2019   Lumbar degenerative disc disease 01/17/2019   History of C5-C6 cervical ACDF 01/08/2019   Vitamin D deficiency 08/06/2018   Thyroid nodule 08/03/2018   No energy 03/25/2018   GERD (gastroesophageal reflux disease) 03/22/2018   Hypothyroidism 03/22/2018   Fibromyalgia 03/22/2018   Depression, major, single episode, complete remission (HCC) 03/22/2018   Class 1 obesity due to excess calories with serious comorbidity and body mass index (BMI) of 34.0 to 34.9 in adult 03/22/2018   Displacement of lumbar intervertebral disc without myelopathy 09/11/2012   Past Medical History:  Diagnosis Date   DDD (degenerative disc disease), lumbar    Depression    Fibromyalgia    GERD (gastroesophageal reflux disease)    Hypothyroidism    Allergies  Allergen Reactions   Gluten Meal Swelling    ?Angioedema   Lyrica [Pregabalin] Swelling   Omnicef [Cefdinir] Swelling    Angioedema   Pineapple Swelling   Trintellix [Vortioxetine] Diarrhea and Nausea Only   Naproxen Rash    Face rash      ROS See hpi   Objective:     BP 135/81   Pulse 84   Ht 5\' 4"  (1.626 m)   Wt 198 lb (89.8 kg)   SpO2 99%   BMI 33.99 kg/m  BP Readings from Last 3 Encounters:  09/08/23 135/81  01/27/23 (!) 151/86  05/06/22 (!) 155/84  Wt Readings from Last 3 Encounters:  09/08/23 198 lb (89.8 kg)  01/27/23 191 lb 1.9 oz (86.7 kg)  05/06/22 189 lb (85.7 kg)      Physical Exam Constitutional:      Appearance: Normal appearance. She is obese.  HENT:     Head: Normocephalic.     Right Ear: Tympanic membrane, ear canal and external ear normal. There is no impacted cerumen.     Left Ear: Tympanic membrane, ear canal and external ear normal. There is no impacted cerumen.     Nose: Nose normal.     Mouth/Throat:     Mouth: Mucous membranes are moist.     Pharynx:  No oropharyngeal exudate or posterior oropharyngeal erythema.  Cardiovascular:     Rate and Rhythm: Normal rate and regular rhythm.     Heart sounds: Normal heart sounds.  Pulmonary:     Effort: Pulmonary effort is normal.     Breath sounds: Normal breath sounds.  Musculoskeletal:     Cervical back: Normal range of motion and neck supple.     Right lower leg: No edema.     Left lower leg: No edema.  Neurological:     General: No focal deficit present.     Mental Status: She is alert and oriented to person, place, and time.  Psychiatric:        Mood and Affect: Mood normal.        Assessment & Plan:  Marland KitchenMarland KitchenTitanna was seen today for medical management of chronic issues.  Diagnoses and all orders for this visit:  Acquired hypothyroidism -     TSH + free T4  Fibromyalgia -     DULoxetine (CYMBALTA) 60 MG capsule; Take 1 capsule (60 mg total) by mouth daily. -     celecoxib (CELEBREX) 200 MG capsule; Take 1 capsule (200 mg total) by mouth 2 (two) times daily. -     CMP14+EGFR -     traMADol (ULTRAM) 50 MG tablet; Take one to two tablets every 8 hours as needed for pain.  Degeneration of intervertebral disc of lumbar region with discogenic back pain and lower extremity pain -     DULoxetine (CYMBALTA) 60 MG capsule; Take 1 capsule (60 mg total) by mouth daily. -     celecoxib (CELEBREX) 200 MG capsule; Take 1 capsule (200 mg total) by mouth 2 (two) times daily. -     CMP14+EGFR -     traMADol (ULTRAM) 50 MG tablet; Take one to two tablets every 8 hours as needed for pain. -     methylPREDNISolone sodium succinate (SOLU-MEDROL) 125 mg/2 mL injection 125 mg -     ketorolac (TORADOL) injection 30 mg  Allergic sinusitis -     CMP14+EGFR -     methylPREDNISolone sodium succinate (SOLU-MEDROL) 125 mg/2 mL injection 125 mg -     ketorolac (TORADOL) injection 30 mg -     cetirizine (ZYRTEC) 10 MG tablet; Take 1 tablet (10 mg total) by mouth daily.  Chronic bilateral low back pain with  bilateral sciatica -     DULoxetine (CYMBALTA) 60 MG capsule; Take 1 capsule (60 mg total) by mouth daily. -     celecoxib (CELEBREX) 200 MG capsule; Take 1 capsule (200 mg total) by mouth 2 (two) times daily. -     methylPREDNISolone sodium succinate (SOLU-MEDROL) 125 mg/2 mL injection 125 mg -     ketorolac (TORADOL) injection 30 mg  Primary hypertension -  hydrochlorothiazide (HYDRODIURIL) 12.5 MG tablet; Take 1 tablet (12.5 mg total) by mouth daily.   Will recheck TSH and adjust accordingly  Discussed chronic pain and fibromyalgia Toradol given IM today Increased cymbalta to 60mg  Added celebrex bid Tramadol for break through pain .Marland KitchenPDMP reviewed during this encounter. No concerns  Solumedrol given for allergies Make sure taking zyrtec and using flonase Follow up as needed   No insurance to do preventative health screenings.    Return in about 3 months (around 12/09/2023), or if symptoms worsen or fail to improve.    Tandy Gaw, PA-C

## 2023-09-09 ENCOUNTER — Encounter: Payer: Self-pay | Admitting: Physician Assistant

## 2023-09-09 LAB — CMP14+EGFR
ALT: 32 [IU]/L (ref 0–32)
AST: 25 [IU]/L (ref 0–40)
Albumin: 4.5 g/dL (ref 3.8–4.9)
Alkaline Phosphatase: 150 [IU]/L — ABNORMAL HIGH (ref 44–121)
BUN/Creatinine Ratio: 20 (ref 9–23)
BUN: 11 mg/dL (ref 6–24)
Bilirubin Total: <0.2 mg/dL (ref 0.0–1.2)
CO2: 22 mmol/L (ref 20–29)
Calcium: 9.9 mg/dL (ref 8.7–10.2)
Chloride: 102 mmol/L (ref 96–106)
Creatinine, Ser: 0.54 mg/dL — ABNORMAL LOW (ref 0.57–1.00)
Globulin, Total: 2.7 g/dL (ref 1.5–4.5)
Glucose: 140 mg/dL — ABNORMAL HIGH (ref 70–99)
Potassium: 4.2 mmol/L (ref 3.5–5.2)
Sodium: 141 mmol/L (ref 134–144)
Total Protein: 7.2 g/dL (ref 6.0–8.5)
eGFR: 108 mL/min/{1.73_m2} (ref >59)

## 2023-09-09 LAB — TSH+FREE T4
Free T4: 0.71 ng/dL — ABNORMAL LOW (ref 0.82–1.77)
TSH: 5.81 u[IU]/mL — ABNORMAL HIGH (ref 0.450–4.500)

## 2023-09-11 ENCOUNTER — Other Ambulatory Visit: Payer: Self-pay | Admitting: Physician Assistant

## 2023-09-11 DIAGNOSIS — E039 Hypothyroidism, unspecified: Secondary | ICD-10-CM

## 2023-09-11 MED ORDER — LEVOTHYROXINE SODIUM 112 MCG PO TABS: 112.0000 ug | ORAL_TABLET | Freq: Every day | ORAL | 0 refills | Status: DC

## 2023-09-11 NOTE — Progress Notes (Signed)
TSH is elevated and free T4 is low. We need to increase synthroid. I sent to pharmacy for 3 months and then recheck labs.

## 2023-09-17 ENCOUNTER — Encounter: Payer: Self-pay | Admitting: Physician Assistant

## 2023-09-17 MED ORDER — HYDROCHLOROTHIAZIDE 12.5 MG PO TABS: 12.5000 mg | ORAL_TABLET | Freq: Every day | ORAL | 1 refills | Status: DC

## 2023-10-18 ENCOUNTER — Other Ambulatory Visit: Payer: Self-pay | Admitting: Physician Assistant

## 2023-10-18 DIAGNOSIS — E039 Hypothyroidism, unspecified: Secondary | ICD-10-CM

## 2023-10-22 ENCOUNTER — Other Ambulatory Visit: Payer: Self-pay | Admitting: Physician Assistant

## 2023-10-22 DIAGNOSIS — E039 Hypothyroidism, unspecified: Secondary | ICD-10-CM

## 2023-10-23 ENCOUNTER — Encounter: Payer: Self-pay | Admitting: Physician Assistant

## 2023-10-23 MED ORDER — ROPINIROLE HCL 0.25 MG PO TABS: ORAL_TABLET | ORAL | 0 refills | Status: DC

## 2023-12-08 ENCOUNTER — Other Ambulatory Visit: Payer: Self-pay | Admitting: Physician Assistant

## 2023-12-15 ENCOUNTER — Ambulatory Visit: Payer: Self-pay | Admitting: Physician Assistant

## 2024-03-04 ENCOUNTER — Other Ambulatory Visit: Payer: Self-pay | Admitting: Physician Assistant

## 2024-03-04 DIAGNOSIS — G4700 Insomnia, unspecified: Secondary | ICD-10-CM

## 2024-03-13 ENCOUNTER — Other Ambulatory Visit: Payer: Self-pay | Admitting: Physician Assistant

## 2024-03-13 DIAGNOSIS — G4700 Insomnia, unspecified: Secondary | ICD-10-CM

## 2024-04-10 ENCOUNTER — Encounter: Payer: Self-pay | Admitting: Physician Assistant

## 2024-04-10 MED ORDER — TRAZODONE HCL 50 MG PO TABS
25.0000 mg | ORAL_TABLET | Freq: Every evening | ORAL | 0 refills | Status: DC | PRN
Start: 2024-04-10 — End: 2024-07-04

## 2024-04-10 MED ORDER — TRAZODONE HCL 50 MG PO TABS: 25.0000 mg | ORAL_TABLET | Freq: Every evening | ORAL | 0 refills | Status: DC | PRN

## 2024-04-10 NOTE — Addendum Note (Signed)
 Addended by: Dickie Found on: 04/10/2024 01:07 PM   Modules accepted: Orders

## 2024-04-12 ENCOUNTER — Other Ambulatory Visit: Payer: Self-pay | Admitting: Physician Assistant

## 2024-04-12 DIAGNOSIS — I10 Essential (primary) hypertension: Secondary | ICD-10-CM

## 2024-06-07 ENCOUNTER — Other Ambulatory Visit: Payer: Self-pay | Admitting: Physician Assistant

## 2024-07-04 ENCOUNTER — Other Ambulatory Visit: Payer: Self-pay | Admitting: Physician Assistant

## 2024-07-11 ENCOUNTER — Telehealth: Payer: Self-pay

## 2024-07-11 NOTE — Telephone Encounter (Signed)
 Left message on voicemail requesting information on mammogram.

## 2024-07-18 ENCOUNTER — Other Ambulatory Visit: Payer: Self-pay | Admitting: Physician Assistant

## 2024-07-18 DIAGNOSIS — I10 Essential (primary) hypertension: Secondary | ICD-10-CM

## 2024-08-29 ENCOUNTER — Other Ambulatory Visit: Payer: Self-pay | Admitting: Physician Assistant

## 2024-08-29 DIAGNOSIS — M51362 Other intervertebral disc degeneration, lumbar region with discogenic back pain and lower extremity pain: Secondary | ICD-10-CM

## 2024-08-29 DIAGNOSIS — M797 Fibromyalgia: Secondary | ICD-10-CM

## 2024-08-29 DIAGNOSIS — G8929 Other chronic pain: Secondary | ICD-10-CM

## 2024-08-30 ENCOUNTER — Ambulatory Visit: Payer: Self-pay | Admitting: Physician Assistant

## 2024-08-30 VITALS — BP 120/90 | HR 100 | Temp 98.5°F | Ht 64.0 in | Wt 185.0 lb

## 2024-08-30 DIAGNOSIS — Z1211 Encounter for screening for malignant neoplasm of colon: Secondary | ICD-10-CM

## 2024-08-30 DIAGNOSIS — G4700 Insomnia, unspecified: Secondary | ICD-10-CM | POA: Diagnosis not present

## 2024-08-30 DIAGNOSIS — I10 Essential (primary) hypertension: Secondary | ICD-10-CM

## 2024-08-30 DIAGNOSIS — M797 Fibromyalgia: Secondary | ICD-10-CM

## 2024-08-30 DIAGNOSIS — L608 Other nail disorders: Secondary | ICD-10-CM

## 2024-08-30 DIAGNOSIS — G8929 Other chronic pain: Secondary | ICD-10-CM

## 2024-08-30 DIAGNOSIS — E039 Hypothyroidism, unspecified: Secondary | ICD-10-CM

## 2024-08-30 DIAGNOSIS — Z1231 Encounter for screening mammogram for malignant neoplasm of breast: Secondary | ICD-10-CM

## 2024-08-30 DIAGNOSIS — J309 Allergic rhinitis, unspecified: Secondary | ICD-10-CM

## 2024-08-30 DIAGNOSIS — R41840 Attention and concentration deficit: Secondary | ICD-10-CM

## 2024-08-30 DIAGNOSIS — Z131 Encounter for screening for diabetes mellitus: Secondary | ICD-10-CM

## 2024-08-30 DIAGNOSIS — M51362 Other intervertebral disc degeneration, lumbar region with discogenic back pain and lower extremity pain: Secondary | ICD-10-CM

## 2024-08-30 DIAGNOSIS — Z13220 Encounter for screening for lipoid disorders: Secondary | ICD-10-CM

## 2024-08-30 MED ORDER — HYDROCHLOROTHIAZIDE 12.5 MG PO TABS: 12.5000 mg | ORAL_TABLET | Freq: Every day | ORAL | 3 refills | Status: AC

## 2024-08-30 MED ORDER — ATOMOXETINE HCL 60 MG PO CAPS: 60.0000 mg | ORAL_CAPSULE | Freq: Every day | ORAL | 0 refills | Status: DC

## 2024-08-30 MED ORDER — TRAZODONE HCL 50 MG PO TABS: 50.0000 mg | ORAL_TABLET | Freq: Every day | ORAL | 3 refills | Status: AC

## 2024-08-30 MED ORDER — CETIRIZINE HCL 10 MG PO TABS: 10.0000 mg | ORAL_TABLET | Freq: Every day | ORAL | 4 refills | Status: AC

## 2024-08-30 MED ORDER — DULOXETINE HCL 60 MG PO CPEP: 60.0000 mg | ORAL_CAPSULE | Freq: Every day | ORAL | 3 refills | Status: AC

## 2024-08-30 NOTE — Patient Instructions (Signed)
 Will make colonoscopy referral.  Next visit schedule pap and mammogram. Will place order.  Avoid nail polish for a while and try biotin for nail growth Labs ordered today Trial of strattera  for focus

## 2024-08-31 LAB — CBC WITH DIFFERENTIAL/PLATELET
Basophils Absolute: 0.1 x10E3/uL (ref 0.0–0.2)
Basos: 1 %
EOS (ABSOLUTE): 0.1 x10E3/uL (ref 0.0–0.4)
Eos: 1 %
Hematocrit: 43 % (ref 34.0–46.6)
Hemoglobin: 14.5 g/dL (ref 11.1–15.9)
Immature Grans (Abs): 0 x10E3/uL (ref 0.0–0.1)
Immature Granulocytes: 0 %
Lymphocytes Absolute: 4 x10E3/uL — ABNORMAL HIGH (ref 0.7–3.1)
Lymphs: 47 %
MCH: 30.5 pg (ref 26.6–33.0)
MCHC: 33.7 g/dL (ref 31.5–35.7)
MCV: 90 fL (ref 79–97)
Monocytes Absolute: 0.7 x10E3/uL (ref 0.1–0.9)
Monocytes: 8 %
Neutrophils Absolute: 3.5 x10E3/uL (ref 1.4–7.0)
Neutrophils: 43 %
Platelets: 306 x10E3/uL (ref 150–450)
RBC: 4.76 x10E6/uL (ref 3.77–5.28)
RDW: 12.6 % (ref 11.7–15.4)
WBC: 8.3 x10E3/uL (ref 3.4–10.8)

## 2024-08-31 LAB — CMP14+EGFR
ALT: 39 IU/L — ABNORMAL HIGH (ref 0–32)
AST: 32 IU/L (ref 0–40)
Albumin: 4.6 g/dL (ref 3.8–4.9)
Alkaline Phosphatase: 151 IU/L — ABNORMAL HIGH (ref 49–135)
BUN/Creatinine Ratio: 16 (ref 9–23)
BUN: 10 mg/dL (ref 6–24)
Bilirubin Total: <0.2 mg/dL (ref 0.0–1.2)
CO2: 22 mmol/L (ref 20–29)
Calcium: 9.7 mg/dL (ref 8.7–10.2)
Chloride: 100 mmol/L (ref 96–106)
Creatinine, Ser: 0.63 mg/dL (ref 0.57–1.00)
Globulin, Total: 2.8 g/dL (ref 1.5–4.5)
Glucose: 111 mg/dL — ABNORMAL HIGH (ref 70–99)
Potassium: 4 mmol/L (ref 3.5–5.2)
Sodium: 140 mmol/L (ref 134–144)
Total Protein: 7.4 g/dL (ref 6.0–8.5)
eGFR: 103 mL/min/1.73 (ref >59)

## 2024-08-31 LAB — LIPID PANEL
Chol/HDL Ratio: 2.9 ratio (ref 0.0–4.4)
Cholesterol, Total: 158 mg/dL (ref 100–199)
HDL: 54 mg/dL (ref >39)
LDL Chol Calc (NIH): 78 mg/dL (ref 0–99)
Triglycerides: 151 mg/dL — ABNORMAL HIGH (ref 0–149)
VLDL Cholesterol Cal: 26 mg/dL (ref 5–40)

## 2024-08-31 LAB — IRON,TIBC AND FERRITIN PANEL
Ferritin: 138 ng/mL (ref 15–150)
Iron Saturation: 22 % (ref 15–55)
Iron: 80 ug/dL (ref 27–159)
Total Iron Binding Capacity: 358 ug/dL (ref 250–450)
UIBC: 278 ug/dL (ref 131–425)

## 2024-08-31 LAB — TSH+FREE T4
Free T4: 1.57 ng/dL (ref 0.82–1.77)
TSH: <0.005 u[IU]/mL — ABNORMAL LOW (ref 0.450–4.500)

## 2024-08-31 LAB — VITAMIN D 25 HYDROXY (VIT D DEFICIENCY, FRACTURES): Vit D, 25-Hydroxy: 61.3 ng/mL (ref 30.0–100.0)

## 2024-08-31 LAB — B12 AND FOLATE PANEL
Folate: 15.9 ng/mL (ref >3.0)
Vitamin B-12: 615 pg/mL (ref 232–1245)

## 2024-09-02 ENCOUNTER — Encounter: Payer: Self-pay | Admitting: Physician Assistant

## 2024-09-02 ENCOUNTER — Ambulatory Visit: Payer: Self-pay | Admitting: Physician Assistant

## 2024-09-02 DIAGNOSIS — G8929 Other chronic pain: Secondary | ICD-10-CM | POA: Insufficient documentation

## 2024-09-02 MED ORDER — LEVOTHYROXINE SODIUM 112 MCG PO TABS: ORAL_TABLET | ORAL | 1 refills | Status: AC

## 2024-09-02 NOTE — Progress Notes (Signed)
 Established Patient Office Visit  Subjective   Patient ID: Melinda Browning, female    DOB: January 11, 1966  Age: 58 y.o. MRN: [id number]  Chief Complaint  Patient presents with   Follow-up    Acute hypothyroidism Would like to talk about ADHD testing Would like to do a complete physical and labs    HPI Discussed the use of AI scribe software for clinical note transcription with the patient, who gave verbal consent to proceed.  History of Present Illness Melinda Browning is a 58 year old female who presents for medication refills and evaluation of anxiety and restless leg syndrome.  Medication management - Requires refills for Cymbalta , Zyrtec , Pepcid , Flonase  as needed, thyroid  medication, and Trazodone  for sleep - Discontinued Requip  due to adverse effects, describing a 'weird feeling'  Restless legs symptoms - Continues to experience restless leg symptoms after discontinuing Requip  - No current medication for restless legs  Anxiety and restlessness - Persistent restlessness, unable to sit still or watch TV - Frequently starts projects without finishing them unless they are important - Lives with her daughter and feels her restlessness affects their relationship - Interested in exploring non-stimulant treatment options similar to her daughter's Strattera  - Daughter and grandson have ADHD  Blood pressure monitoring - Blood pressure readings at [facility] typically around 120/80 mmHg - Does not check blood pressure at home due to malfunctioning machine - No chest pain or shortness of breath  Nail and skin changes - Nails appear dark and dry, attributed to nail polish use and shoe rubbing - Uses shoe cushions to alleviate discomfort  Menopausal symptoms and gynecologic history - No menstrual period for nine months - Inquiring about menopause - No Pap smear in eight years - Not sexually active  Supplement use - Takes Vitamin D , 5000 units daily   ROS See HPI.    Objective:      BP (!) 120/90   Pulse 100   Temp 98.5 F (36.9 C) (Oral)   Ht 5' 4 (1.626 m)   Wt 185 lb (83.9 kg)   SpO2 100%   BMI 31.76 kg/m  BP Readings from Last 3 Encounters:  08/30/24 (!) 120/90  09/08/23 135/81  01/27/23 (!) 151/86   Wt Readings from Last 3 Encounters:  08/30/24 185 lb (83.9 kg)  09/08/23 198 lb (89.8 kg)  01/27/23 191 lb 1.9 oz (86.7 kg)      Physical Exam Constitutional:      Appearance: Normal appearance.  HENT:     Head: Normocephalic.  Cardiovascular:     Rate and Rhythm: Normal rate and regular rhythm.  Pulmonary:     Effort: Pulmonary effort is normal.     Breath sounds: Normal breath sounds.  Musculoskeletal:     Right lower leg: No edema.     Left lower leg: No edema.  Neurological:     General: No focal deficit present.     Mental Status: She is alert and oriented to person, place, and time.  Psychiatric:        Mood and Affect: Mood normal.     . Adult ADHD Self Report Scale (most recent)     Adult ADHD Self-Report Scale (ASRS-v1.1) Symptom Checklist - 08/30/24 1409       Part A   1. How often do you have trouble wrapping up the final details of a project, once the challenging parts have been done? Often  2. How often do you have difficulty getting things done in order when  you have to do a task that requires organization? Very Often    3. How often do you have problems remembering appointments or obligations? Very Often  4. When you have a task that requires a lot of thought, how often do you avoid or delay getting started? Very Often    5. How often do you fidget or squirm with your hands or feet when you have to sit down for a long time? Very Often  6. How often do you feel overly active and compelled to do things, like you were driven by a motor? Very Often      Part B   7. How often do you make careless mistakes when you have to work on a boring or difficult project? Often  8. How often do you have difficulty keeping your attention  when you are doing boring or repetitive work? Very Often    9. How often do you have difficulty concentrating on what people say to you, even when they are speaking to you directly? Often  10. How often do you misplace or have difficulty finding things at home or at work? Very Often    11. How often are you distracted by activity or noise around you? Often  12. How often do you leave your seat in meetings or other situations in which you are expected to remain seated? Often    13. How often do you feel restless or fidgety? Often  14. How often do you have difficulty unwinding and relaxing when you have time to yourself? Very Often    15. How often do you find yourself talking too much when you are in social situations? Often  16. When you are in a conversation, how often do you find yourself finishing the sentences of the people you are talking to, before they can finish them themselves? Very Often    17. How often do you have difficulty waiting your turn in situations when turn taking is required? Very Often  18. How often do you interrupt others when they are busy? Often           The 10-year ASCVD risk score (Arnett DK, et al., 2019) is: 2.3%    Assessment & Plan:  SABRASABRANuriya was seen today for follow-up.  Diagnoses and all orders for this visit:  Acquired hypothyroidism -     TSH + free T4  Fibromyalgia -     TSH + free T4 -     CMP14+EGFR -     CBC w/Diff/Platelet -     Fe+TIBC+Fer -     Lipid panel -     B12 and Folate Panel -     VITAMIN D  25 Hydroxy (Vit-D Deficiency, Fractures) -     DULoxetine  (CYMBALTA ) 60 MG capsule; Take 1 capsule (60 mg total) by mouth daily.  Primary hypertension -     CMP14+EGFR -     hydrochlorothiazide  (HYDRODIURIL ) 12.5 MG tablet; Take 1 tablet (12.5 mg total) by mouth daily.  Insomnia, unspecified type -     traZODone  (DESYREL ) 50 MG tablet; Take 1 tablet (50 mg total) by mouth at bedtime. TAKE 1/2 TO 1 (ONE-HALF TO ONE) TABLET BY MOUTH AT  BEDTIME AS NEEDED FOR SLEEP  Change in nail appearance -     TSH + free T4 -     CMP14+EGFR -     CBC w/Diff/Platelet -     Fe+TIBC+Fer -     Lipid panel -  B12 and Folate Panel -     VITAMIN D  25 Hydroxy (Vit-D Deficiency, Fractures)  Screening for diabetes mellitus -     CMP14+EGFR  Screening for lipid disorders -     Lipid panel  Degeneration of intervertebral disc of lumbar region with discogenic back pain and lower extremity pain -     DULoxetine  (CYMBALTA ) 60 MG capsule; Take 1 capsule (60 mg total) by mouth daily.  Chronic bilateral low back pain with bilateral sciatica -     DULoxetine  (CYMBALTA ) 60 MG capsule; Take 1 capsule (60 mg total) by mouth daily.  Allergic sinusitis -     cetirizine  (ZYRTEC ) 10 MG tablet; Take 1 tablet (10 mg total) by mouth daily.  Colon cancer screening -     Ambulatory referral to Gastroenterology  Visit for screening mammogram -     MM 3D SCREENING MAMMOGRAM BILATERAL BREAST  Inattention -     atomoxetine  (STRATTERA ) 60 MG capsule; Take 1 capsule (60 mg total) by mouth daily.    Assessment & Plan Restless legs syndrome Persistent symptoms despite adverse effects from Requip .  Anxiety disorder Symptoms worsened by stressors. Potential ADHD discussed due to family history. Interested in Strattera  after daughter's improvement. - Administer ADHD assessment form. Which was positive for ADHD. - Consider trial of Strattera .  Hypothyroidism - recheck levels today and adjust accordingly  Menopausal state Confirmed by nine months of amenorrhea. Symptoms mild, hormone replacement not recommended. - Advise on lifestyle modifications for symptom management.  General Health Maintenance Due for colonoscopy and mammogram. Pap smear frequency reduced due to low risk. - Refer for colonoscopy. - Reviewed last Pap smear date and patient needs to schedule for pap smear.    Return in about 3 months (around 11/29/2024), or if symptoms  worsen or fail to improve, for follow up.    Eziah Negro, PA-C

## 2024-09-02 NOTE — Progress Notes (Signed)
 Melinda Browning,   B12 and vitamin D  look great.  Iron looks good.  Kidney function looks great.  Liver enzymes stable. Weight loss could help with that.  LDL looks great.  TG a little elevated. Walk 30 minutes a day. Stay away from processed foods.  Free T4 normal but TSH trending towards HYPER too much thyroid . I would like to decrease medication a little are you ok with that? You are taking 112mcg correct?

## 2024-09-03 MED ORDER — EPINEPHRINE 0.3 MG/0.3ML IJ SOAJ: 0.3000 mg | INTRAMUSCULAR | 1 refills | Status: AC | PRN

## 2024-09-03 NOTE — Addendum Note (Signed)
 Addended by: ANTONIETTE VERMELL CROME on: 09/03/2024 11:05 AM   Modules accepted: Orders

## 2024-09-03 NOTE — Addendum Note (Signed)
 Addended by: Leshae Mcclay P on: 09/03/2024 08:22 AM   Modules accepted: Orders

## 2024-09-03 NOTE — Telephone Encounter (Signed)
 Requesting rx rf of Epipen  Last written 08/29/2023 Last OV 08/30/2024 Upcoming appt 11/29/2024

## 2024-10-07 MED ORDER — ATOMOXETINE HCL 40 MG PO CAPS: 40.0000 mg | ORAL_CAPSULE | Freq: Every day | ORAL | 0 refills | Status: DC

## 2024-10-07 NOTE — Addendum Note (Signed)
 Addended by: ANTONIETTE VERMELL CROME on: 10/07/2024 09:53 AM   Modules accepted: Orders

## 2024-11-29 ENCOUNTER — Ambulatory Visit: Admitting: Physician Assistant

## 2024-12-09 ENCOUNTER — Encounter: Payer: Self-pay | Admitting: Physician Assistant

## 2024-12-09 ENCOUNTER — Ambulatory Visit

## 2024-12-09 ENCOUNTER — Ambulatory Visit: Admitting: Physician Assistant

## 2024-12-09 VITALS — BP 127/87 | HR 78 | Ht 64.0 in | Wt 173.0 lb

## 2024-12-09 DIAGNOSIS — M797 Fibromyalgia: Secondary | ICD-10-CM

## 2024-12-09 DIAGNOSIS — E041 Nontoxic single thyroid nodule: Secondary | ICD-10-CM

## 2024-12-09 DIAGNOSIS — E559 Vitamin D deficiency, unspecified: Secondary | ICD-10-CM

## 2024-12-09 DIAGNOSIS — R630 Anorexia: Secondary | ICD-10-CM | POA: Insufficient documentation

## 2024-12-09 DIAGNOSIS — E039 Hypothyroidism, unspecified: Secondary | ICD-10-CM

## 2024-12-09 DIAGNOSIS — R41840 Attention and concentration deficit: Secondary | ICD-10-CM

## 2024-12-09 DIAGNOSIS — G4700 Insomnia, unspecified: Secondary | ICD-10-CM

## 2024-12-09 DIAGNOSIS — Z1231 Encounter for screening mammogram for malignant neoplasm of breast: Secondary | ICD-10-CM | POA: Diagnosis not present

## 2024-12-09 DIAGNOSIS — Z1211 Encounter for screening for malignant neoplasm of colon: Secondary | ICD-10-CM

## 2024-12-09 DIAGNOSIS — J4540 Moderate persistent asthma, uncomplicated: Secondary | ICD-10-CM

## 2024-12-09 DIAGNOSIS — I10 Essential (primary) hypertension: Secondary | ICD-10-CM | POA: Diagnosis not present

## 2024-12-09 MED ORDER — ATOMOXETINE HCL 40 MG PO CAPS: 40.0000 mg | ORAL_CAPSULE | Freq: Every day | ORAL | 1 refills | Status: AC

## 2024-12-09 MED ORDER — VITAMIN D (ERGOCALCIFEROL) 1.25 MG (50000 UNIT) PO CAPS: 50000.0000 [IU] | ORAL_CAPSULE | ORAL | 1 refills | Status: AC

## 2024-12-09 NOTE — Patient Instructions (Addendum)
 Goal of 60g of protein a day Stay on vitamin D  weekly 1200-1500 calories daily Continue Strattera  for focus and trazodone  for sleep.

## 2024-12-10 ENCOUNTER — Ambulatory Visit: Payer: Self-pay | Admitting: Physician Assistant

## 2024-12-10 LAB — CMP14+EGFR
ALT: 24 IU/L (ref 0–32)
AST: 23 IU/L (ref 0–40)
Albumin: 4.6 g/dL (ref 3.8–4.9)
Alkaline Phosphatase: 128 IU/L (ref 49–135)
BUN/Creatinine Ratio: 24 — ABNORMAL HIGH (ref 9–23)
BUN: 15 mg/dL (ref 6–24)
Bilirubin Total: 0.2 mg/dL (ref 0.0–1.2)
CO2: 26 mmol/L (ref 20–29)
Calcium: 10.2 mg/dL (ref 8.7–10.2)
Chloride: 101 mmol/L (ref 96–106)
Creatinine, Ser: 0.63 mg/dL (ref 0.57–1.00)
Globulin, Total: 2.6 g/dL (ref 1.5–4.5)
Glucose: 90 mg/dL (ref 70–99)
Potassium: 4 mmol/L (ref 3.5–5.2)
Sodium: 141 mmol/L (ref 134–144)
Total Protein: 7.2 g/dL (ref 6.0–8.5)
eGFR: 103 mL/min/1.73

## 2024-12-10 LAB — MAGNESIUM: Magnesium: 2.2 mg/dL (ref 1.6–2.3)

## 2024-12-10 LAB — SPECIMEN STATUS REPORT

## 2024-12-10 LAB — CBC WITH DIFFERENTIAL/PLATELET
Basophils Absolute: 0.1 x10E3/uL (ref 0.0–0.2)
Basos: 1 %
EOS (ABSOLUTE): 0.1 x10E3/uL (ref 0.0–0.4)
Eos: 1 %
Hematocrit: 42.7 % (ref 34.0–46.6)
Hemoglobin: 14.1 g/dL (ref 11.1–15.9)
Immature Grans (Abs): 0 x10E3/uL (ref 0.0–0.1)
Immature Granulocytes: 0 %
Lymphocytes Absolute: 2.8 x10E3/uL (ref 0.7–3.1)
Lymphs: 37 %
MCH: 29.6 pg (ref 26.6–33.0)
MCHC: 33 g/dL (ref 31.5–35.7)
MCV: 90 fL (ref 79–97)
Monocytes Absolute: 0.6 x10E3/uL (ref 0.1–0.9)
Monocytes: 8 %
Neutrophils Absolute: 4.1 x10E3/uL (ref 1.4–7.0)
Neutrophils: 53 %
Platelets: 324 x10E3/uL (ref 150–450)
RBC: 4.76 x10E6/uL (ref 3.77–5.28)
RDW: 12.8 % (ref 11.7–15.4)
WBC: 7.7 x10E3/uL (ref 3.4–10.8)

## 2024-12-10 LAB — TSH+FREE T4
Free T4: 1.59 ng/dL (ref 0.82–1.77)
TSH: <0.005 u[IU]/mL — ABNORMAL LOW (ref 0.450–4.500)

## 2024-12-10 NOTE — Progress Notes (Signed)
 Regana,   TSH trending low but free t4 looks good. If having any hyper thyroid  symptoms suggest a slight decrease in thyroid  medication otherwise can stay at same dose.   Glucose, liver, kidney function looks good.

## 2024-12-11 DIAGNOSIS — J4540 Moderate persistent asthma, uncomplicated: Secondary | ICD-10-CM | POA: Insufficient documentation

## 2024-12-11 DIAGNOSIS — R41840 Attention and concentration deficit: Secondary | ICD-10-CM | POA: Insufficient documentation

## 2024-12-11 MED ORDER — FLUTICASONE PROPIONATE HFA 110 MCG/ACT IN AERO: INHALATION_SPRAY | RESPIRATORY_TRACT | 3 refills | Status: AC

## 2024-12-11 NOTE — Progress Notes (Signed)
 "  Established Patient Office Visit  Subjective   Patient ID: Melinda Browning, female    DOB: 1966-10-18  Age: 59 y.o. MRN: [id number]  Chief Complaint  Patient presents with   Medical Management of Chronic Issues    HPI .Discussed the use of AI scribe software for clinical note transcription with the patient, who gave verbal consent to proceed.  History of Present Illness Melinda Browning is a 59 year old female who presents for medication follow-up and refills.  Sleep disturbance - Sleep has improved with trazodone . - Restless leg syndrome has improved with trazodone .  Attention deficit and executive function symptoms - Takes strattera  for ADHD, which improves task management and recognition of need for rest. - Occasionally experiences feeling 'a little too slow,' which has been a lifelong experience. - Strattera  is working well and would like refills - no side effects  Chronic pain and mood symptoms - Continues duloxetine  (Cymbalta ) for pain and mood, with beneficial effect. Taking 60 mg daily.   weight loss and decreased appetite - Weight decreased from 185 pounds to 173 pounds. - Reduced appetite, possibly related to medication. - Dietary changes include avoidance of processed foods due to liver concerns. - Concerned about inadequate caloric and protein intake, aiming for 1200-1500 calories and 60 grams of protein daily. - History of childhood aversion to certain foods and periods of not eating; currently eats for nutritional necessity despite lack of desire.  Oropharyngeal pain and thyroid  nodule - Experiences mild pain with swallowing. - no hoarsness or choking - History of right thyroid  nodule; thyroid  ultrasound performed a few years ago.  Cardiopulmonary symptoms - No chest pain or shortness of breath.  Preventive health maintenance - Colonoscopy and mammogram not yet completed.    ROS See HPI.    Objective:     BP 127/87   Pulse 78   Ht 5' 4 (1.626 m)   Wt  173 lb (78.5 kg)   SpO2 99%   BMI 29.70 kg/m  BP Readings from Last 3 Encounters:  12/09/24 127/87  08/30/24 (!) 120/90  09/08/23 135/81   Wt Readings from Last 3 Encounters:  12/09/24 173 lb (78.5 kg)  08/30/24 185 lb (83.9 kg)  09/08/23 198 lb (89.8 kg)      Physical Exam Constitutional:      Appearance: Normal appearance.  HENT:     Head: Normocephalic.  Neck:     Comments: Bilateral soft mobile anterior lymphadenopathy.  Cardiovascular:     Rate and Rhythm: Normal rate and regular rhythm.  Pulmonary:     Effort: Pulmonary effort is normal.     Breath sounds: Normal breath sounds.  Neurological:     General: No focal deficit present.     Mental Status: She is alert and oriented to person, place, and time.  Psychiatric:        Mood and Affect: Mood normal.      The 10-year ASCVD risk score (Arnett DK, et al., 2019) is: 2.6%    Assessment & Plan:  SABRASABRAIzzabell was seen today for medical management of chronic issues.  Diagnoses and all orders for this visit:  Fibromyalgia  Acquired hypothyroidism -     TSH + free T4  Primary hypertension -     CMP14+EGFR -     CBC w/Diff/Platelet -     Magnesium  Insomnia, unspecified type  Thyroid  nodule -     US  THYROID ; Future -     TSH + free T4 -  Specimen status report  Vitamin D  deficiency -     Vitamin D , Ergocalciferol , (DRISDOL ) 1.25 MG (50000 UNIT) CAPS capsule; Take 1 capsule (50,000 Units total) by mouth every 7 (seven) days.  Almost no appetite -     TSH + free T4 -     CMP14+EGFR -     CBC w/Diff/Platelet -     Magnesium  Visit for screening mammogram -     MM 3D SCREENING MAMMOGRAM BILATERAL BREAST  Colon cancer screening -     Ambulatory referral to Gastroenterology  Attention or concentration deficit -     atomoxetine  (STRATTERA ) 40 MG capsule; Take 1 capsule (40 mg total) by mouth daily.   Assessment & Plan Hypothyroidism Thyroid  function to be assessed with labs. - Ordered thyroid   function tests.  Thyroid  nodule Right thyroid  nodule with previous ultrasound. Due for repeat ultrasound. - Ordered thyroid  ultrasound.  Primary hypertension Blood pressure elevated today. No chest pain or shortness of breath reported. - Rechecked blood pressure.  Restless legs syndrome Symptoms improved with trazodone .  Anxiety disorder Managed with duloxetine , which is effective for mood and pain.  Insomnia Improved with trazodone .  Vitamin D  deficiency Vitamin levels to be checked to assess current status. - Ordered vitamin level tests.  Decreased appetite with weight loss Weight loss of 12 pounds since last visit. Concerns about decreased appetite potentially related to medication. Discussed importance of maintaining caloric intake and protein consumption. Advised on intermittent fasting and reducing processed foods to manage liver health. - Ordered vitamin level tests. - Advised on maintaining 1200-1500 calories per day. - Advised on consuming 60 grams of protein per day. - Advised on reducing processed foods. - Advised on intermittent fasting.  General Health Maintenance Colonoscopy and mammogram not yet completed. Due for these screenings. - Ordered colonoscopy and mammogram in [place]. - Vitamin D  refilled for high dose - Flovent  refilled for controlled asthma      Return in about 6 months (around 06/08/2025).    Melinda Connell, PA-C  "

## 2024-12-11 NOTE — Progress Notes (Signed)
 Melinda Browning,   Right sided thyroid  nodule to keep yearly follow up due to size.   Noted bilateral lymph node enlargement but no overt concerns and same size as previously imaged.

## 2024-12-17 ENCOUNTER — Telehealth: Payer: Self-pay | Admitting: Pharmacy Technician

## 2024-12-17 ENCOUNTER — Other Ambulatory Visit (HOSPITAL_COMMUNITY): Payer: Self-pay

## 2024-12-17 MED ORDER — ARNUITY ELLIPTA 100 MCG/ACT IN AEPB: 1.0000 | INHALATION_SPRAY | Freq: Every day | RESPIRATORY_TRACT | 11 refills | Status: AC

## 2024-12-17 NOTE — Telephone Encounter (Signed)
 Pharmacy Patient Advocate Encounter   Received notification from [employer] CMM KEY that prior authorization for Fluticasone  Propionate HFA 110MCG/ACT aerosol  is required/requested.   Insurance verification completed.   The patient is insured through [facility].   Per test claim:  Arnuity Ellipta  is preferred by the insurance.  If suggested medication is appropriate, Please send in a new RX and discontinue this one. If not, please advise as to why it's not appropriate so that we may request a Prior Authorization. Please note, some preferred medications may still require a PA.  If the suggested medications have not been trialed and there are no contraindications to their use, the PA will not be submitted, as it will not be approved.  CMM Key# [id number]

## 2024-12-17 NOTE — Addendum Note (Signed)
 Addended by: ANTONIETTE VERMELL CROME on: 12/17/2024 04:40 PM   Modules accepted: Orders

## 2024-12-17 NOTE — Telephone Encounter (Signed)
 Task completed. The patient has been updated that alternative Arnuity Ellipta  inhaler was sent to the pharmacy. The patient is aware this inhaler will replaced Flovent . No other inquiries during the call.

## 2024-12-17 NOTE — Telephone Encounter (Signed)
 Per the TE from [facility] - the preferred alternative is: Arnuity Ellipta . Please send in an updated rx to the pharmacy. Thanks in advance.

## 2024-12-17 NOTE — Telephone Encounter (Signed)
 Please let patient know sent arunity 1 puff once a day to pharmacy to replace her flovent .

## 2025-06-09 ENCOUNTER — Ambulatory Visit: Admitting: Physician Assistant
